# Patient Record
Sex: Male | Born: 1960
Health system: Southern US, Community
[De-identification: ages and names within clinical notes are randomized; demographics above are authoritative.]

## PROBLEM LIST (undated history)

## (undated) DIAGNOSIS — J45909 Unspecified asthma, uncomplicated: Secondary | ICD-10-CM

## (undated) DIAGNOSIS — G4730 Sleep apnea, unspecified: Secondary | ICD-10-CM

## (undated) DIAGNOSIS — K219 Gastro-esophageal reflux disease without esophagitis: Secondary | ICD-10-CM

## (undated) DIAGNOSIS — C801 Malignant (primary) neoplasm, unspecified: Secondary | ICD-10-CM

## (undated) DIAGNOSIS — I10 Essential (primary) hypertension: Secondary | ICD-10-CM

## (undated) DIAGNOSIS — I499 Cardiac arrhythmia, unspecified: Secondary | ICD-10-CM

## (undated) HISTORY — DX: Cardiac arrhythmia, unspecified: I49.9

## (undated) HISTORY — DX: Essential (primary) hypertension: I10

## (undated) HISTORY — DX: Malignant (primary) neoplasm, unspecified: C80.1

## (undated) HISTORY — PX: OTHER SURGICAL HISTORY: SHX169

## (undated) HISTORY — DX: Unspecified asthma, uncomplicated: J45.909

## (undated) HISTORY — DX: Gastro-esophageal reflux disease without esophagitis: K21.9

---

## 2011-09-20 DIAGNOSIS — G4733 Obstructive sleep apnea (adult) (pediatric): Secondary | ICD-10-CM | POA: Insufficient documentation

## 2011-09-20 DIAGNOSIS — J45909 Unspecified asthma, uncomplicated: Secondary | ICD-10-CM | POA: Insufficient documentation

## 2012-06-02 DIAGNOSIS — Z8546 Personal history of malignant neoplasm of prostate: Secondary | ICD-10-CM | POA: Insufficient documentation

## 2018-01-22 DIAGNOSIS — Z0000 Encounter for general adult medical examination without abnormal findings: Secondary | ICD-10-CM | POA: Diagnosis not present

## 2018-01-22 DIAGNOSIS — I10 Essential (primary) hypertension: Secondary | ICD-10-CM | POA: Diagnosis not present

## 2018-06-01 DIAGNOSIS — Z23 Encounter for immunization: Secondary | ICD-10-CM | POA: Diagnosis not present

## 2018-10-21 ENCOUNTER — Ambulatory Visit: Payer: Self-pay | Admitting: Family Medicine

## 2018-11-04 ENCOUNTER — Ambulatory Visit (INDEPENDENT_AMBULATORY_CARE_PROVIDER_SITE_OTHER): Payer: BLUE CROSS/BLUE SHIELD | Admitting: Family Medicine

## 2018-11-04 ENCOUNTER — Other Ambulatory Visit: Payer: Self-pay

## 2018-11-04 ENCOUNTER — Encounter: Payer: Self-pay | Admitting: Family Medicine

## 2018-11-04 VITALS — BP 130/96 | HR 69 | Temp 98.0°F | Ht 72.0 in | Wt 202.2 lb

## 2018-11-04 DIAGNOSIS — Z0000 Encounter for general adult medical examination without abnormal findings: Secondary | ICD-10-CM | POA: Diagnosis not present

## 2018-11-04 DIAGNOSIS — Z8546 Personal history of malignant neoplasm of prostate: Secondary | ICD-10-CM | POA: Diagnosis not present

## 2018-11-04 DIAGNOSIS — I10 Essential (primary) hypertension: Secondary | ICD-10-CM | POA: Insufficient documentation

## 2018-11-04 DIAGNOSIS — E785 Hyperlipidemia, unspecified: Secondary | ICD-10-CM | POA: Diagnosis not present

## 2018-11-04 LAB — COMPREHENSIVE METABOLIC PANEL
ALT: 22 U/L (ref 0–53)
AST: 16 U/L (ref 0–37)
Albumin: 4.7 g/dL (ref 3.5–5.2)
Alkaline Phosphatase: 75 U/L (ref 39–117)
BUN: 16 mg/dL (ref 6–23)
CO2: 33 mEq/L — ABNORMAL HIGH (ref 19–32)
Calcium: 10 mg/dL (ref 8.4–10.5)
Chloride: 98 mEq/L (ref 96–112)
Creatinine, Ser: 0.88 mg/dL (ref 0.40–1.50)
GFR: 88.84 mL/min (ref >60.00)
Glucose, Bld: 95 mg/dL (ref 70–99)
Potassium: 3.8 mEq/L (ref 3.5–5.1)
Sodium: 137 mEq/L (ref 135–145)
Total Bilirubin: 0.9 mg/dL (ref 0.2–1.2)
Total Protein: 7.5 g/dL (ref 6.0–8.3)

## 2018-11-04 LAB — POCT URINALYSIS DIPSTICK
Bilirubin, UA: NEGATIVE
Blood, UA: NEGATIVE
Glucose, UA: NEGATIVE
Ketones, UA: NEGATIVE
LEUKOCYTES UA: NEGATIVE
Nitrite, UA: NEGATIVE
Protein, UA: NEGATIVE
Spec Grav, UA: >=1.03 — AB (ref 1.010–1.025)
Urobilinogen, UA: 0.2 E.U./dL
pH, UA: 5 (ref 5.0–8.0)

## 2018-11-04 LAB — LIPID PANEL
Cholesterol: 179 mg/dL (ref 0–200)
HDL: 59.7 mg/dL (ref >39.00)
LDL Cholesterol: 97 mg/dL (ref 0–99)
NonHDL: 118.84
Total CHOL/HDL Ratio: 3
Triglycerides: 110 mg/dL (ref 0.0–149.0)
VLDL: 22 mg/dL (ref 0.0–40.0)

## 2018-11-04 LAB — CBC
HCT: 47.1 % (ref 39.0–52.0)
Hemoglobin: 16.1 g/dL (ref 13.0–17.0)
MCHC: 34.1 g/dL (ref 30.0–36.0)
MCV: 91.2 fl (ref 78.0–100.0)
Platelets: 223 10*3/uL (ref 150.0–400.0)
RBC: 5.17 Mil/uL (ref 4.22–5.81)
RDW: 13.6 % (ref 11.5–15.5)
WBC: 8.8 10*3/uL (ref 4.0–10.5)

## 2018-11-04 LAB — PSA: PSA: 0.32 ng/mL (ref 0.10–4.00)

## 2018-11-04 LAB — TSH: TSH: 3.34 u[IU]/mL (ref 0.35–4.50)

## 2018-11-04 MED ORDER — OMEPRAZOLE 40 MG PO CPDR: 40.0000 mg | DELAYED_RELEASE_CAPSULE | Freq: Every day | ORAL | 3 refills | Status: DC

## 2018-11-04 MED ORDER — HYDROCHLOROTHIAZIDE 25 MG PO TABS: 25.0000 mg | ORAL_TABLET | Freq: Every day | ORAL | 3 refills | Status: DC

## 2018-11-04 MED ORDER — AZELASTINE HCL 0.1 % NA SOLN: 2.0000 | Freq: Two times a day (BID) | NASAL | 6 refills | Status: DC

## 2018-11-04 MED ORDER — CARVEDILOL 12.5 MG PO TABS: 12.5000 mg | ORAL_TABLET | Freq: Two times a day (BID) | ORAL | 3 refills | Status: DC

## 2018-11-04 MED ORDER — LOSARTAN POTASSIUM 100 MG PO TABS: 100.0000 mg | ORAL_TABLET | Freq: Every day | ORAL | 3 refills | Status: DC

## 2018-11-04 NOTE — Assessment & Plan Note (Signed)
-  Well adult Orders Placed This Encounter  Procedures  . Comp Met (CMET)  . CBC  . Lipid Profile  . TSH  . PSA  . POCT Urinalysis Dipstick   Immunizations:  Up to date Screenings: up to date Anticipatory guidance/risk factor reduction:  Per AVS

## 2018-11-04 NOTE — Progress Notes (Signed)
Clarence Perez - 58 y.o. male MRN [id number]  Date of birth: 1961-06-19  Subjective Chief Complaint  Patient presents with  . Establish Care    CPE-- Fasting last meal 630 am/ last colonoscopy 2017    HPI Clarence Perez is a 58 y.o. male with history of HTN, prostate cancer s/p cryoablation, GERD, mild HLD, asthma/seasonal allergies here today for initial visit and annual exam.  He reports he is doing well.  He is up to date on colon cancer screening with last test completed in 2017.  He is due for repeat in 2022.  He has no concerns today.  He remains active and follows a fairly healthy diet.  He does have history of OSA as well but has been unable to tolerate CPAP.    Review of Systems  Constitutional: Negative for chills, fever, malaise/fatigue and weight loss.  HENT: Negative for congestion, ear pain and sore throat.   Eyes: Negative for blurred vision, double vision and pain.  Respiratory: Negative for cough and shortness of breath.   Cardiovascular: Negative for chest pain and palpitations.  Gastrointestinal: Negative for abdominal pain, blood in stool, constipation, heartburn and nausea.  Genitourinary: Negative for dysuria and urgency.  Musculoskeletal: Negative for joint pain and myalgias.  Neurological: Negative for dizziness and headaches.  Endo/Heme/Allergies: Does not bruise/bleed easily.  Psychiatric/Behavioral: Negative for depression. The patient is not nervous/anxious and does not have insomnia.     Allergies  Allergen Reactions  . Ace Inhibitors Cough    Past Medical History:  Diagnosis Date  . Asthma   . Cancer [facility])    prostate   . GERD (gastroesophageal reflux disease)   . Hypertension     Past Surgical History:  Procedure Laterality Date  . prostate cryoblation      Social History   Socioeconomic History  . Marital status: Married    Spouse name: Not on file  . Number of children: Not on file  . Years of education: Not on file  . Highest  education level: Not on file  Occupational History  . Not on file  Social Needs  . Financial resource strain: Not on file  . Food insecurity:    Worry: Not on file    Inability: Not on file  . Transportation needs:    Medical: Not on file    Non-medical: Not on file  Tobacco Use  . Smoking status: Never Smoker  . Smokeless tobacco: Never Used  Substance and Sexual Activity  . Alcohol use: Yes    Comment: socially  . Drug use: Never  . Sexual activity: Not on file  Lifestyle  . Physical activity:    Days per week: Not on file    Minutes per session: Not on file  . Stress: Not on file  Relationships  . Social connections:    Talks on phone: Not on file    Gets together: Not on file    Attends religious service: Not on file    Active member of club or organization: Not on file    Attends meetings of clubs or organizations: Not on file    Relationship status: Not on file  Other Topics Concern  . Not on file  Social History Narrative  . Not on file    Family History  Problem Relation Age of Onset  . Cancer Mother        colon  . Cancer Father        prostate and kidney  Health Maintenance  Topic Date Due  . Hepatitis C Screening  10/15/1959  . HIV Screening  07/05/1975  . COLONOSCOPY  07/04/2010  . TETANUS/TDAP  07/21/2019  . INFLUENZA VACCINE  Completed    ----------------------------------------------------------------------------------------------------------------------------------------------------------------------------------------------------------------- Physical Exam BP (!) 130/96   Pulse 69   Temp 98 F (36.7 C) (Oral)   Ht 6' (1.829 m)   Wt 202 lb 3.2 oz (91.7 kg)   SpO2 96%   BMI 27.42 kg/m   Physical Exam Constitutional:      General: He is not in acute distress. HENT:     Head: Normocephalic and atraumatic.     Right Ear: External ear normal.     Left Ear: External ear normal.     Mouth/Throat:     Mouth: Mucous membranes are  moist.  Eyes:     General: No scleral icterus. Neck:     Musculoskeletal: Normal range of motion.     Thyroid: No thyromegaly.  Cardiovascular:     Rate and Rhythm: Normal rate and regular rhythm.     Heart sounds: Normal heart sounds.  Pulmonary:     Effort: Pulmonary effort is normal.     Breath sounds: Normal breath sounds.  Abdominal:     General: Bowel sounds are normal. There is no distension.     Palpations: Abdomen is soft.     Tenderness: There is no abdominal tenderness. There is no guarding.  Lymphadenopathy:     Cervical: No cervical adenopathy.  Skin:    General: Skin is warm and dry.     Findings: No rash.  Neurological:     General: No focal deficit present.     Mental Status: He is alert and oriented to person, place, and time.     Cranial Nerves: No cranial nerve deficit.     Motor: No abnormal muscle tone.  Psychiatric:        Mood and Affect: Mood normal.        Behavior: Behavior normal.     ------------------------------------------------------------------------------------------------------------------------------------------------------------------------------------------------------------------- Assessment and Plan  Well adult exam -Well adult Orders Placed This Encounter  Procedures  . Comp Met (CMET)  . CBC  . Lipid Profile  . TSH  . PSA  . POCT Urinalysis Dipstick   Immunizations:  Up to date Screenings: up to date Anticipatory guidance/risk factor reduction:  Per AVS  Hyperlipidemia -Update lipid panel today.   History of prostate cancer -due for annual PSA, ordered.   Essential hypertension -Fairly well controlled, recommend continuation of current medications.

## 2018-11-04 NOTE — Patient Instructions (Signed)
Preventive Care 40-64 Years, Male Preventive care refers to lifestyle choices and visits with your health care provider that can promote health and wellness. What does preventive care include?   A yearly physical exam. This is also called an annual well check.  Dental exams once or twice a year.  Routine eye exams. Ask your health care provider how often you should have your eyes checked.  Personal lifestyle choices, including: ? Daily care of your teeth and gums. ? Regular physical activity. ? Eating a healthy diet. ? Avoiding tobacco and drug use. ? Limiting alcohol use. ? Practicing safe sex. ? Taking low-dose aspirin every day starting at age 50. What happens during an annual well check? The services and screenings done by your health care provider during your annual well check will depend on your age, overall health, lifestyle risk factors, and family history of disease. Counseling Your health care provider may ask you questions about your:  Alcohol use.  Tobacco use.  Drug use.  Emotional well-being.  Home and relationship well-being.  Sexual activity.  Eating habits.  Work and work environment. Screening You may have the following tests or measurements:  Height, weight, and BMI.  Blood pressure.  Lipid and cholesterol levels. These may be checked every 5 years, or more frequently if you are over 50 years old.  Skin check.  Lung cancer screening. You may have this screening every year starting at age 55 if you have a 30-pack-year history of smoking and currently smoke or have quit within the past 15 years.  Colorectal cancer screening. All adults should have this screening starting at age 50 and continuing until age 75. Your health care provider may recommend screening at age 45. You will have tests every 1-10 years, depending on your results and the type of screening test. People at increased risk should start screening at an earlier age. Screening tests may  include: ? Guaiac-based fecal occult blood testing. ? Fecal immunochemical test (FIT). ? Stool DNA test. ? Virtual colonoscopy. ? Sigmoidoscopy. During this test, a flexible tube with a tiny camera (sigmoidoscope) is used to examine your rectum and lower colon. The sigmoidoscope is inserted through your anus into your rectum and lower colon. ? Colonoscopy. During this test, a long, thin, flexible tube with a tiny camera (colonoscope) is used to examine your entire colon and rectum.  Prostate cancer screening. Recommendations will vary depending on your family history and other risks.  Hepatitis C blood test.  Hepatitis B blood test.  Sexually transmitted disease (STD) testing.  Diabetes screening. This is done by checking your blood sugar (glucose) after you have not eaten for a while (fasting). You may have this done every 1-3 years. Discuss your test results, treatment options, and if necessary, the need for more tests with your health care provider. Vaccines Your health care provider may recommend certain vaccines, such as:  Influenza vaccine. This is recommended every year.  Tetanus, diphtheria, and acellular pertussis (Tdap, Td) vaccine. You may need a Td booster every 10 years.  Varicella vaccine. You may need this if you have not been vaccinated.  Zoster vaccine. You may need this after age 60.  Measles, mumps, and rubella (MMR) vaccine. You may need at least one dose of MMR if you were born in 1957 or later. You may also need a second dose.  Pneumococcal 13-valent conjugate (PCV13) vaccine. You may need this if you have certain conditions and have not been vaccinated.  Pneumococcal polysaccharide (PPSV23) vaccine.   You may need one or two doses if you smoke cigarettes or if you have certain conditions.  Meningococcal vaccine. You may need this if you have certain conditions.  Hepatitis A vaccine. You may need this if you have certain conditions or if you travel or work in  places where you may be exposed to hepatitis A.  Hepatitis B vaccine. You may need this if you have certain conditions or if you travel or work in places where you may be exposed to hepatitis B.  Haemophilus influenzae type b (Hib) vaccine. You may need this if you have certain risk factors. Talk to your health care provider about which screenings and vaccines you need and how often you need them. This information is not intended to replace advice given to you by your health care provider. Make sure you discuss any questions you have with your health care provider. Document Released: 09/01/2015 Document Revised: 09/25/2017 Document Reviewed: 06/06/2015 Elsevier Interactive Patient Education  2019 Elsevier Inc.  

## 2018-11-04 NOTE — Assessment & Plan Note (Signed)
-  Fairly well controlled, recommend continuation of current medications.

## 2018-11-04 NOTE — Assessment & Plan Note (Signed)
-  due for annual PSA, ordered.

## 2018-11-04 NOTE — Assessment & Plan Note (Signed)
Update lipid panel today 

## 2019-06-11 DIAGNOSIS — Z7189 Other specified counseling: Secondary | ICD-10-CM | POA: Diagnosis not present

## 2019-10-25 ENCOUNTER — Other Ambulatory Visit: Payer: Self-pay | Admitting: Family Medicine

## 2019-10-25 NOTE — Telephone Encounter (Signed)
CM-Plz see refill req/thx dmf 

## 2020-01-29 ENCOUNTER — Other Ambulatory Visit: Payer: Self-pay | Admitting: Family Medicine

## 2020-02-01 NOTE — Telephone Encounter (Signed)
CM-Plz see refill req/thx dmf 

## 2020-02-09 ENCOUNTER — Ambulatory Visit (INDEPENDENT_AMBULATORY_CARE_PROVIDER_SITE_OTHER): Payer: BC Managed Care – PPO | Admitting: Family Medicine

## 2020-02-09 ENCOUNTER — Other Ambulatory Visit: Payer: Self-pay

## 2020-02-09 ENCOUNTER — Encounter: Payer: Self-pay | Admitting: Family Medicine

## 2020-02-09 VITALS — BP 146/90 | HR 62 | Temp 97.3°F | Ht 72.0 in | Wt 204.2 lb

## 2020-02-09 DIAGNOSIS — I10 Essential (primary) hypertension: Secondary | ICD-10-CM

## 2020-02-09 DIAGNOSIS — Z0000 Encounter for general adult medical examination without abnormal findings: Secondary | ICD-10-CM

## 2020-02-09 DIAGNOSIS — Z8546 Personal history of malignant neoplasm of prostate: Secondary | ICD-10-CM

## 2020-02-09 DIAGNOSIS — E785 Hyperlipidemia, unspecified: Secondary | ICD-10-CM

## 2020-02-09 DIAGNOSIS — K2100 Gastro-esophageal reflux disease with esophagitis, without bleeding: Secondary | ICD-10-CM

## 2020-02-09 DIAGNOSIS — J4520 Mild intermittent asthma, uncomplicated: Secondary | ICD-10-CM

## 2020-02-09 LAB — BASIC METABOLIC PANEL
BUN: 12 mg/dL (ref 6–23)
CO2: 34 mEq/L — ABNORMAL HIGH (ref 19–32)
Calcium: 10.2 mg/dL (ref 8.4–10.5)
Chloride: 98 mEq/L (ref 96–112)
Creatinine, Ser: 0.93 mg/dL (ref 0.40–1.50)
GFR: 82.99 mL/min (ref >60.00)
Glucose, Bld: 102 mg/dL — ABNORMAL HIGH (ref 70–99)
Potassium: 3.9 mEq/L (ref 3.5–5.1)
Sodium: 137 mEq/L (ref 135–145)

## 2020-02-09 LAB — LIPID PANEL
Cholesterol: 184 mg/dL (ref 0–200)
HDL: 46.8 mg/dL (ref >39.00)
LDL Cholesterol: 103 mg/dL — ABNORMAL HIGH (ref 0–99)
NonHDL: 136.99
Total CHOL/HDL Ratio: 4
Triglycerides: 171 mg/dL — ABNORMAL HIGH (ref 0.0–149.0)
VLDL: 34.2 mg/dL (ref 0.0–40.0)

## 2020-02-09 LAB — ALT: ALT: 26 U/L (ref 0–53)

## 2020-02-09 LAB — AST: AST: 19 U/L (ref 0–37)

## 2020-02-09 LAB — PSA: PSA: 0.29 ng/mL (ref 0.10–4.00)

## 2020-02-09 MED ORDER — HYDROCHLOROTHIAZIDE 25 MG PO TABS: 25.0000 mg | ORAL_TABLET | Freq: Every day | ORAL | 3 refills | Status: DC

## 2020-02-09 MED ORDER — LOSARTAN POTASSIUM 100 MG PO TABS: 100.0000 mg | ORAL_TABLET | Freq: Every day | ORAL | 3 refills | Status: DC

## 2020-02-09 MED ORDER — AZELASTINE HCL 0.1 % NA SOLN: 2.0000 | Freq: Two times a day (BID) | NASAL | 6 refills | Status: DC

## 2020-02-09 MED ORDER — CARVEDILOL 12.5 MG PO TABS: 12.5000 mg | ORAL_TABLET | Freq: Two times a day (BID) | ORAL | 3 refills | Status: DC

## 2020-02-09 NOTE — Progress Notes (Signed)
Clarence Perez is a 59 y.o. male  Chief Complaint  Patient presents with  . Establish Care    Pt here for a physical.  Pt is fasting for lab, he had breakfast around 6:30am.    HPI: Clarence Perez is a 59 y.o. male here for [facility] appt, previous PCP Dr. Zigmund Daniel, and annual CPE. He ate breakfast around 6:30am this AM. Pt is pain management physician.   Last Colonoscopy: due in 2022 - previously done thru [facility]  Diet/Exercise: average diet, working to lose weight and has lost 5lbs recently  Dental: UTD Eye: pt wears glasses, UTD on exam  Med refills needed today: yes - see orders  Past Medical History:  Diagnosis Date  . Asthma   . Cancer [facility])    prostate   . GERD (gastroesophageal reflux disease)   . Hypertension     Past Surgical History:  Procedure Laterality Date  . prostate cryoblation      Social History   Socioeconomic History  . Marital status: Married    Spouse name: Not on file  . Number of children: Not on file  . Years of education: Not on file  . Highest education level: Not on file  Occupational History  . Not on file  Tobacco Use  . Smoking status: Never Smoker  . Smokeless tobacco: Never Used  Vaping Use  . Vaping Use: Never used  Substance and Sexual Activity  . Alcohol use: Yes    Comment: socially  . Drug use: Never  . Sexual activity: Not on file  Other Topics Concern  . Not on file  Social History Narrative  . Not on file   Social Determinants of Health   Financial Resource Strain:   . Difficulty of Paying Living Expenses:   Food Insecurity:   . Worried About Charity fundraiser in the Last Year:   . Arboriculturist in the Last Year:   Transportation Needs:   . Film/video editor (Medical):   Marland Kitchen Lack of Transportation (Non-Medical):   Physical Activity:   . Days of Exercise per Week:   . Minutes of Exercise per Session:   Stress:   . Feeling of Stress :   Social Connections:   . Frequency of Communication with Friends  and Family:   . Frequency of Social Gatherings with Friends and Family:   . Attends Religious Services:   . Active Member of Clubs or Organizations:   . Attends Archivist Meetings:   Marland Kitchen Marital Status:   Intimate Partner Violence:   . Fear of Current or Ex-Partner:   . Emotionally Abused:   Marland Kitchen Physically Abused:   . Sexually Abused:     Family History  Problem Relation Age of Onset  . Cancer Mother        colon  . Cancer Father        prostate and kidney     Immunization History  Administered Date(s) Administered  . Influenza,inj,Quad PF,6+ Mos 06/01/2018  . Influenza-Unspecified 08/19/2008, 05/29/2012  . Tdap 07/20/2009    Outpatient Encounter Medications as of 02/09/2020  Medication Sig  . albuterol (PROVENTIL HFA;VENTOLIN HFA) 108 (90 Base) MCG/ACT inhaler Inhale into the lungs.  Marland Kitchen azelastine (ASTELIN) 0.1 % nasal spray Place 2 sprays into both nostrils 2 (two) times daily. Use in each nostril as directed  . cetirizine (ZYRTEC) 10 MG tablet Take by mouth.  . diclofenac Sodium (VOLTAREN) 1 % GEL 2 gm applied to each  shoulder 1-2 times daily  . fluticasone (FLONASE) 50 MCG/ACT nasal spray USE ONE SPRAY IN EACH NOSTRIL TWO TIMES A DAY  . hydrochlorothiazide (HYDRODIURIL) 25 MG tablet Take 1 tablet (25 mg total) by mouth daily.  Marland Kitchen losartan (COZAAR) 100 MG tablet Take 1 tablet (100 mg total) by mouth daily.  Marland Kitchen omeprazole (PRILOSEC) 40 MG capsule Take 1 capsule (40 mg total) by mouth daily.  . [DISCONTINUED] azelastine (ASTELIN) 0.1 % nasal spray USE 2 SPRAYS IN EACH NOSTRIL TWICE DAILY  . [DISCONTINUED] hydrochlorothiazide (HYDRODIURIL) 25 MG tablet Take 1 tablet (25 mg total) by mouth daily.  . [DISCONTINUED] losartan (COZAAR) 100 MG tablet Take 1 tablet (100 mg total) by mouth daily.  . carvedilol (COREG) 12.5 MG tablet Take 1 tablet (12.5 mg total) by mouth 2 (two) times daily with a meal.  . Magnesium Citrate 125 MG CAPS Take by mouth. (Patient not taking: Reported  on 02/09/2020)  . [DISCONTINUED] carvedilol (COREG) 12.5 MG tablet Take 1 tablet (12.5 mg total) by mouth 2 (two) times daily with a meal.   No facility-administered encounter medications on file as of 02/09/2020.     ROS: Gen: no fever, chills, fatigue Skin: no rash, itching ENT: no ear pain, ear drainage, nasal congestion, rhinorrhea, sinus pressure, sore throat Resp: pt has h/o asthma and at times experiences chest tightness and cough, relieved with albuterol PRN, uses 1x/wk, sometimes less CV: no CP, palpitations, LE edema GI: no heartburn, n/v/d/c, abd pain GU: no dysuria, urgency, frequency, hematuria; h/o prostate cancer MSK: no joint pain, myalgias, back pain Neuro: no dizziness, headache, weakness; pt with generalized fasciculations x 2 years since viral URI  Psych: no depression, anxiety, insomnia   Allergies  Allergen Reactions  . Ace Inhibitors Cough    BP (!) 146/90 (BP Location: Left Arm, Patient Position: Sitting, Cuff Size: Normal)   Pulse 62   Temp (!) 97.3 F (36.3 C) (Temporal)   Ht 6' (1.829 m)   Wt 204 lb 3.2 oz (92.6 kg)   SpO2 96%   BMI 27.69 kg/m    BP Readings from Last 3 Encounters:  02/09/20 (!) 146/90  11/04/18 (!) 130/96   Wt Readings from Last 3 Encounters:  02/09/20 204 lb 3.2 oz (92.6 kg)  11/04/18 202 lb 3.2 oz (91.7 kg)    Physical Exam Constitutional:      General: He is not in acute distress.    Appearance: He is well-developed.  HENT:     Head: Normocephalic and atraumatic.     Right Ear: Tympanic membrane and ear canal normal.     Left Ear: Tympanic membrane and ear canal normal.     Nose: Nose normal.  Neck:     Thyroid: No thyromegaly.  Cardiovascular:     Rate and Rhythm: Normal rate and regular rhythm.     Heart sounds: Normal heart sounds.  Pulmonary:     Effort: Pulmonary effort is normal.     Breath sounds: Normal breath sounds. No wheezing or rhonchi.  Abdominal:     General: Bowel sounds are normal. There is no  distension.     Palpations: Abdomen is soft. There is no mass.     Tenderness: There is no abdominal tenderness. There is no guarding or rebound.  Musculoskeletal:        General: Normal range of motion.     Cervical back: Neck supple.  Lymphadenopathy:     Cervical: No cervical adenopathy.  Skin:    General:  Skin is warm and dry.  Neurological:     Mental Status: He is alert and oriented to person, place, and time.     Motor: No abnormal muscle tone.     Coordination: Coordination normal.      A/P:  1. Annual physical exam - UTD on colonoscopy - due in 2022 - UTD on immunizations - UTD on dental and vision - discussed importance of regular CV exercise, healthy diet, adequate sleep - pt has lost 5lbs recently! - Lipid panel - Basic metabolic panel - AST - ALT - next CPE in 1 year  2. Essential hypertension - not at goal - recommended DASH diet, regular CV exercise, work to achieve and maintain goal weight - pt to check BP at home ~ 3x/wk x 2 wks then send or call with BP readings. If above goal, consider increase in coreg from 12.5mg  BID. With asthma hx, could consider 12.5mg  qAM and 12.5mg  qPM or even 12.5mg  TID dosing Refill: - carvedilol (COREG) 12.5 MG tablet; Take 1 tablet (12.5 mg total) by mouth 2 (two) times daily with a meal.  Dispense: 180 tablet; Refill: 3 - hydrochlorothiazide (HYDRODIURIL) 25 MG tablet; Take 1 tablet (25 mg total) by mouth daily.  Dispense: 90 tablet; Refill: 3 - losartan (COZAAR) 100 MG tablet; Take 1 tablet (100 mg total) by mouth daily.  Dispense: 90 tablet; Refill: 3 - Basic metabolic panel  3. Hyperlipidemia, unspecified hyperlipidemia type - not on med - Lipid panel  4. History of prostate cancer - not longer follows with urology, just annual PSA - PSA  5. Gastroesophageal reflux disease with esophagitis without hemorrhage - stable, controlled with diet/lifestyle modification, PPI, and PRN pepcid complete  6. Mild intermittent  asthma without complication - overall well controlled, stable - uses PRN albuterol 1x/wk or every few weeks, notes using it a bit more frequently in recent weeks - no recent exacerbations   This visit occurred during the SARS-CoV-2 public health emergency.  Safety protocols were in place, including screening questions prior to the visit, additional usage of staff PPE, and extensive cleaning of exam room while observing appropriate contact time as indicated for disinfecting solutions.

## 2020-02-09 NOTE — Patient Instructions (Signed)
Health Maintenance Due  Topic Date Due  . Hepatitis C Screening  Never done  . COVID-19 Vaccine (1) Never done  . HIV Screening  Never done  . TETANUS/TDAP  07/21/2019    No flowsheet data found.

## 2020-02-10 ENCOUNTER — Encounter: Payer: Self-pay | Admitting: Family Medicine

## 2020-03-25 ENCOUNTER — Other Ambulatory Visit: Payer: Self-pay | Admitting: Family Medicine

## 2020-12-09 ENCOUNTER — Other Ambulatory Visit: Payer: Self-pay | Admitting: Family Medicine

## 2021-02-09 ENCOUNTER — Other Ambulatory Visit: Payer: Self-pay | Admitting: Family Medicine

## 2021-02-09 DIAGNOSIS — I10 Essential (primary) hypertension: Secondary | ICD-10-CM

## 2021-02-14 ENCOUNTER — Encounter: Payer: Self-pay | Admitting: Family Medicine

## 2021-02-14 ENCOUNTER — Ambulatory Visit (INDEPENDENT_AMBULATORY_CARE_PROVIDER_SITE_OTHER): Payer: 59 | Admitting: Family Medicine

## 2021-02-14 ENCOUNTER — Other Ambulatory Visit: Payer: Self-pay

## 2021-02-14 VITALS — BP 136/86 | HR 86 | Temp 98.2°F | Ht 71.5 in | Wt 202.4 lb

## 2021-02-14 DIAGNOSIS — R002 Palpitations: Secondary | ICD-10-CM

## 2021-02-14 DIAGNOSIS — I10 Essential (primary) hypertension: Secondary | ICD-10-CM | POA: Diagnosis not present

## 2021-02-14 DIAGNOSIS — Z8546 Personal history of malignant neoplasm of prostate: Secondary | ICD-10-CM | POA: Diagnosis not present

## 2021-02-14 DIAGNOSIS — J4520 Mild intermittent asthma, uncomplicated: Secondary | ICD-10-CM

## 2021-02-14 DIAGNOSIS — Z0000 Encounter for general adult medical examination without abnormal findings: Secondary | ICD-10-CM | POA: Diagnosis not present

## 2021-02-14 DIAGNOSIS — Z1211 Encounter for screening for malignant neoplasm of colon: Secondary | ICD-10-CM

## 2021-02-14 DIAGNOSIS — Z23 Encounter for immunization: Secondary | ICD-10-CM

## 2021-02-14 DIAGNOSIS — E785 Hyperlipidemia, unspecified: Secondary | ICD-10-CM | POA: Diagnosis not present

## 2021-02-14 DIAGNOSIS — Z1212 Encounter for screening for malignant neoplasm of rectum: Secondary | ICD-10-CM

## 2021-02-14 NOTE — Progress Notes (Signed)
Clarence Perez is a 60 y.o. male  Chief Complaint  Patient presents with   Annual Exam    HPI: Clarence Perez is a 60 y.o. male patient seen today for annual CPE, fasting labs.   Last Colonoscopy: with [facility], due in 2022.  H/o prostate cancer. No longer follows with urology, just annual PSA.   Dental: UTD Eye: pt wears glasses, UTD on exam  Shingles, Tdap vaccines due.  Palpitations - 5-6 years ago; "marked sinus arrhythmia" on EKG at that time.  Occasional palpitations, mild nausea, mild orthostasis - symptoms last hours (1/2 day or so). No CP, SOB. Feels like he needs to take a deep breath. Happens every couple of months. Last episode yesterday AM.  Happens more frequently when skiing in [place] and staying at higher elevation.  Seems to happen more when he doesn't sleep well.  Devotes 8 hours per night and thinks he gets 6hrs per night. Wakes 2x/night to void.  No AM headaches, daytime fatigue.  Sleep study at [facility] 2010-2011. "Severe OSA", tried CPAP but did not tolerate.  Past Medical History:  Diagnosis Date   Asthma    Cancer ([place])    prostate    GERD (gastroesophageal reflux disease)    Hypertension     Past Surgical History:  Procedure Laterality Date   prostate cryoblation      Social History   Socioeconomic History   Marital status: Married    Spouse name: Not on file   Number of children: Not on file   Years of education: Not on file   Highest education level: Not on file  Occupational History   Not on file  Tobacco Use   Smoking status: Never   Smokeless tobacco: Never  Vaping Use   Vaping Use: Never used  Substance and Sexual Activity   Alcohol use: Yes    Comment: socially   Drug use: Never   Sexual activity: Not on file  Other Topics Concern   Not on file  Social History Narrative   Not on file   Social Determinants of Health   Financial Resource Strain: Not on file  Food Insecurity: Not on file  Transportation Needs: Not on file   Physical Activity: Not on file  Stress: Not on file  Social Connections: Not on file  Intimate Partner Violence: Not on file    Family History  Problem Relation Age of Onset   Cancer Mother        colon   Cancer Father        prostate and kidney     Immunization History  Administered Date(s) Administered   Influenza,inj,Quad PF,6+ Mos 06/01/2018   Influenza-Unspecified 08/19/2008, 05/29/2012   Tdap 07/20/2009    Outpatient Encounter Medications as of 02/14/2021  Medication Sig   albuterol (PROVENTIL HFA;VENTOLIN HFA) 108 (90 Base) MCG/ACT inhaler Inhale into the lungs.   azelastine (ASTELIN) 0.1 % nasal spray USE 2 SPRAYS IN EACH NOSTRIL TWICE DAILY AS DIRECTED   carvedilol (COREG) 12.5 MG tablet TAKE 1 TABLET(12.5 MG) BY MOUTH TWICE DAILY WITH A MEAL   cetirizine (ZYRTEC) 10 MG tablet Take by mouth.   diclofenac Sodium (VOLTAREN) 1 % GEL 2 gm applied to each shoulder 1-2 times daily   fluticasone (FLONASE) 50 MCG/ACT nasal spray USE ONE SPRAY IN EACH NOSTRIL TWO TIMES A DAY   hydrochlorothiazide (HYDRODIURIL) 25 MG tablet Take 1 tablet (25 mg total) by mouth daily.   losartan (COZAAR) 100 MG tablet Take 1 tablet (  100 mg total) by mouth daily.   omeprazole (PRILOSEC) 40 MG capsule TAKE 1 CAPSULE(40 MG) BY MOUTH DAILY   [DISCONTINUED] acetaZOLAMIDE (DIAMOX) 250 MG tablet Take 250 mg by mouth 2 (two) times daily. (Patient not taking: Reported on 02/14/2021)   [DISCONTINUED] carvedilol (COREG) 12.5 MG tablet Take 1 tablet (12.5 mg total) by mouth 2 (two) times daily with a meal.   [DISCONTINUED] Magnesium Citrate 125 MG CAPS Take by mouth. (Patient not taking: No sig reported)   No facility-administered encounter medications on file as of 02/14/2021.     ROS: Gen: no fever, chills  Skin: no rash, itching ENT: no ear pain, ear drainage, nasal congestion, rhinorrhea, sinus pressure, sore throat Eyes: no blurry vision, double vision Resp: no cough, wheeze,SOB CV: as above in  HPI GI: no heartburn, n/v/d/c, abd pain GU: no dysuria, urgency, frequency, hematuria; no testicular swelling or masses MSK: no joint pain, myalgias, back pain Neuro: no dizziness, headache, weakness, vertigo Psych: no depression, anxiety, insomnia   Allergies  Allergen Reactions   Ace Inhibitors Cough    BP 136/86 (BP Location: Left Arm, Patient Position: Sitting, Cuff Size: Large)   Pulse 86   Temp 98.2 F (36.8 C) (Temporal)   Ht 5' 11.5" (1.816 m)   Wt 202 lb 6.4 oz (91.8 kg)   SpO2 98%   BMI 27.84 kg/m  Wt Readings from Last 3 Encounters:  02/14/21 202 lb 6.4 oz (91.8 kg)  02/09/20 204 lb 3.2 oz (92.6 kg)  11/04/18 202 lb 3.2 oz (91.7 kg)   Temp Readings from Last 3 Encounters:  02/14/21 98.2 F (36.8 C) (Temporal)  02/09/20 (!) 97.3 F (36.3 C) (Temporal)  11/04/18 98 F (36.7 C) (Oral)   BP Readings from Last 3 Encounters:  02/14/21 136/86  02/09/20 (!) 146/90  11/04/18 (!) 130/96   Pulse Readings from Last 3 Encounters:  02/14/21 86  02/09/20 62  11/04/18 69     Physical Exam Constitutional:      General: He is not in acute distress.    Appearance: He is well-developed.  HENT:     Head: Normocephalic and atraumatic.     Right Ear: Tympanic membrane and ear canal normal.     Left Ear: Tympanic membrane and ear canal normal.     Nose: Nose normal.     Mouth/Throat:     Mouth: Mucous membranes are moist.     Pharynx: Oropharynx is clear.  Eyes:     Extraocular Movements: Extraocular movements intact.  Neck:     Thyroid: No thyromegaly.  Cardiovascular:     Rate and Rhythm: Normal rate and regular rhythm.     Heart sounds: Normal heart sounds.  Pulmonary:     Effort: Pulmonary effort is normal.     Breath sounds: Normal breath sounds. No wheezing or rhonchi.  Abdominal:     General: Bowel sounds are normal. There is no distension.     Palpations: Abdomen is soft. There is no mass.     Tenderness: There is no abdominal tenderness. There is no  guarding or rebound.  Musculoskeletal:     Cervical back: Neck supple.     Right lower leg: No edema.     Left lower leg: No edema.  Lymphadenopathy:     Cervical: No cervical adenopathy.  Skin:    General: Skin is warm and dry.  Neurological:     Mental Status: He is alert and oriented to person, place, and time.  Motor: No abnormal muscle tone.     Coordination: Coordination normal.  Psychiatric:        Mood and Affect: Mood normal.        Behavior: Behavior normal.     A/P:  1. Annual physical exam - discussed importance of regular CV exercise, healthy diet, adequate sleep - due for CRC screening, referral placed today - dental and vision UTD - Tdap today, pt states he had shingrix vaccine - CBC - Basic metabolic panel - AST - ALT - EKG 12-Lead - next CPE in 1 year  2. Essential hypertension - at goal - cont losartan 100mg  daily, HCTZ 25mg  daily, coreg 12.5mg  BID - CBC - Basic metabolic panel - EKG 21-RZNB  3. Hyperlipidemia, unspecified hyperlipidemia type - not on statin - Lipid panel - AST - ALT  4. Mild intermittent asthma without complication - stable, controlled  5. History of prostate cancer - no longer follows with urology, just annual PSA - PSA  6. Need for Tdap vaccination - Tdap vaccine greater than or equal to 7yo IM  7. Screening for colorectal cancer - previously done at [facility] - due in 2022 - Ambulatory referral to Gastroenterology  8. Intermittent palpitations - occur every few months x 5-6 years - could be d/t anxiety vs untreated OSA vs other - EKG, labs today. If WNL, would recommend echo and repeat sleep study since last done > 10 years ago - TSH - EKG 12-Lead    This visit occurred during the SARS-CoV-2 public health emergency.  Safety protocols were in place, including screening questions prior to the visit, additional usage of staff PPE, and extensive cleaning of exam room while observing appropriate contact time as  indicated for disinfecting solutions.

## 2021-02-15 ENCOUNTER — Encounter: Payer: Self-pay | Admitting: Family Medicine

## 2021-02-15 LAB — LIPID PANEL
Cholesterol: 174 mg/dL (ref 0–200)
HDL: 51.1 mg/dL (ref >39.00)
LDL Cholesterol: 94 mg/dL (ref 0–99)
NonHDL: 122.95
Total CHOL/HDL Ratio: 3
Triglycerides: 143 mg/dL (ref 0.0–149.0)
VLDL: 28.6 mg/dL (ref 0.0–40.0)

## 2021-02-15 LAB — BASIC METABOLIC PANEL
BUN: 18 mg/dL (ref 6–23)
CO2: 32 mEq/L (ref 19–32)
Calcium: 10.1 mg/dL (ref 8.4–10.5)
Chloride: 99 mEq/L (ref 96–112)
Creatinine, Ser: 1.04 mg/dL (ref 0.40–1.50)
GFR: 77.98 mL/min (ref >60.00)
Glucose, Bld: 99 mg/dL (ref 70–99)
Potassium: 3.9 mEq/L (ref 3.5–5.1)
Sodium: 140 mEq/L (ref 135–145)

## 2021-02-15 LAB — CBC
HCT: 45.1 % (ref 39.0–52.0)
Hemoglobin: 15.4 g/dL (ref 13.0–17.0)
MCHC: 34.2 g/dL (ref 30.0–36.0)
MCV: 90.5 fl (ref 78.0–100.0)
Platelets: 259 10*3/uL (ref 150.0–400.0)
RBC: 4.98 Mil/uL (ref 4.22–5.81)
RDW: 13.3 % (ref 11.5–15.5)
WBC: 6.3 10*3/uL (ref 4.0–10.5)

## 2021-02-15 LAB — AST: AST: 20 U/L (ref 0–37)

## 2021-02-15 LAB — ALT: ALT: 25 U/L (ref 0–53)

## 2021-02-15 LAB — TSH: TSH: 2.92 u[IU]/mL (ref 0.35–5.50)

## 2021-02-15 LAB — PSA: PSA: 0.45 ng/mL (ref 0.10–4.00)

## 2021-02-16 ENCOUNTER — Other Ambulatory Visit: Payer: Self-pay

## 2021-02-16 DIAGNOSIS — I10 Essential (primary) hypertension: Secondary | ICD-10-CM

## 2021-02-16 MED ORDER — LOSARTAN POTASSIUM 100 MG PO TABS: 100.0000 mg | ORAL_TABLET | Freq: Every day | ORAL | 3 refills | Status: DC

## 2021-02-16 MED ORDER — OMEPRAZOLE 40 MG PO CPDR: DELAYED_RELEASE_CAPSULE | ORAL | 3 refills | Status: DC

## 2021-02-16 MED ORDER — CARVEDILOL 12.5 MG PO TABS: ORAL_TABLET | ORAL | 3 refills | Status: DC

## 2021-02-16 MED ORDER — HYDROCHLOROTHIAZIDE 25 MG PO TABS: 25.0000 mg | ORAL_TABLET | Freq: Every day | ORAL | 3 refills | Status: DC

## 2021-02-16 MED ORDER — AZELASTINE HCL 0.1 % NA SOLN: NASAL | 3 refills | Status: DC

## 2021-02-16 NOTE — Telephone Encounter (Signed)
RX refills sent to the pharmacy as requested. Dm/cma

## 2021-02-21 ENCOUNTER — Encounter: Payer: BC Managed Care – PPO | Admitting: Family Medicine

## 2021-03-22 ENCOUNTER — Telehealth: Payer: Self-pay | Admitting: Family Medicine

## 2021-03-22 NOTE — Telephone Encounter (Signed)
Pt would like his referral resent to [facility] on [facility] in [place]. They told him they did not have his referral this morning. He is also wanting a paper copy of this referral, so he can have it in hand. Please advise pt at [phone].

## 2021-03-23 NOTE — Telephone Encounter (Signed)
Order printed and given to patients wife. Dm/cma

## 2021-06-13 ENCOUNTER — Encounter: Payer: Self-pay | Admitting: Nurse Practitioner

## 2021-06-13 ENCOUNTER — Other Ambulatory Visit: Payer: Self-pay

## 2021-06-13 ENCOUNTER — Ambulatory Visit: Payer: 59 | Admitting: Nurse Practitioner

## 2021-06-13 VITALS — BP 136/80 | HR 88 | Temp 97.3°F | Ht 71.5 in | Wt 203.6 lb

## 2021-06-13 DIAGNOSIS — R002 Palpitations: Secondary | ICD-10-CM | POA: Diagnosis not present

## 2021-06-13 DIAGNOSIS — I10 Essential (primary) hypertension: Secondary | ICD-10-CM

## 2021-06-13 NOTE — Patient Instructions (Addendum)
You will be contacted to schedule appt for echocardiogram and to provide event monitor  Call office if symptoms worsen Go to ED if you develop chest pain and/or syncopal episode.  Palpitations Palpitations are feelings that your heartbeat is irregular or is faster than normal. It may feel like your heart is fluttering or skipping a beat. Palpitations are usually not a serious problem. They may be caused by many things, including smoking, caffeine, alcohol, stress, and certain medicines or drugs. Most causes of palpitations are not serious. However, some palpitations can be a sign of a serious problem. You may need further tests to rule out serious medical problems. Follow these instructions at home:   Pay attention to any changes in your condition. Take these actions to help manage your symptoms: Eating and drinking Avoid foods and drinks that may cause palpitations. These may include: Caffeinated coffee, tea, soft drinks, diet pills, and energy drinks. Chocolate. Alcohol. Lifestyle Take steps to reduce your stress and anxiety. Things that can help you relax include: Yoga. Mind-body activities, such as deep breathing, meditation, or using words and images to create positive thoughts (guided imagery). Physical activity, such as swimming, jogging, or walking. Tell your health care provider if your palpitations increase with activity. If you have chest pain or shortness of breath with activity, do not continue the activity until you are seen by your health care provider. Biofeedback. This is a method that helps you learn to use your mind to control things in your body, such as your heartbeat. Do not use drugs, including cocaine or ecstasy. Do not use marijuana. Get plenty of rest and sleep. Keep a regular bed time. General instructions Take over-the-counter and prescription medicines only as told by your health care provider. Do not use any products that contain nicotine or tobacco, such as  cigarettes and e-cigarettes. If you need help quitting, ask your health care provider. Keep all follow-up visits as told by your health care provider. This is important. These may include visits for further testing if palpitations do not go away or get worse. Contact a health care provider if you: Continue to have a fast or irregular heartbeat after 24 hours. Notice that your palpitations occur more often. Get help right away if you: Have chest pain or shortness of breath. Have a severe headache. Feel dizzy or you faint. Summary Palpitations are feelings that your heartbeat is irregular or is faster than normal. It may feel like your heart is fluttering or skipping a beat. Palpitations may be caused by many things, including smoking, caffeine, alcohol, stress, certain medicines, and drugs. Although most causes of palpitations are not serious, some causes can be a sign of a serious medical problem. Get help right away if you faint or have chest pain, shortness of breath, a severe headache, or dizziness. This information is not intended to replace advice given to you by your health care provider. Make sure you discuss any questions you have with your health care provider. Document Revised: 09/10/2017 Document Reviewed: 09/17/2017 Elsevier Patient Education  2022 [employer].

## 2021-06-13 NOTE — Progress Notes (Signed)
Subjective:  Patient ID: Clarence Perez, male    DOB: 03/13/1961  Age: 60 y.o. MRN: [id number]  CC: Establish Care (TOC-Dr. Jerilynn Mages Cirigliano/Pt would like to discuss heart palpitations that has been going on for several years. Denies any numbness or tingling in upper extremities or any other symptoms. )  Palpitations  This is a chronic (onset 48yrs ago) problem. The current episode started more than 1 year ago. The problem occurs intermittently (once a month). The problem has been unchanged. Episode Length: several hours. [place] Associated symptoms include chest fullness and an irregular heartbeat. Pertinent negatives include no anxiety, chest pain, coughing, diaphoresis, dizziness, fever, malaise/fatigue, nausea, near-syncope, numbness, shortness of breath, syncope, vomiting or weakness. He has tried nothing (current use of coreg for HTN) for the symptoms. There are no known risk factors. There is no history of anemia, anxiety, drug use, heart disease, hyperthyroidism or a valve disorder.  Caffeine intake: 1cup in AM ETOH: 1drink about 4x/week 1lead ECG recorded with home monitor: possible sinus tachycardia vs A-fib.  Reviewed past Medical, Social and Family history today.  Outpatient Medications Prior to Visit  Medication Sig Dispense Refill   albuterol (PROVENTIL HFA;VENTOLIN HFA) 108 (90 Base) MCG/ACT inhaler Inhale into the lungs.     azelastine (ASTELIN) 0.1 % nasal spray USE 2 SPRAYS IN EACH NOSTRIL TWICE DAILY AS DIRECTED 90 mL 3   carvedilol (COREG) 12.5 MG tablet TAKE 1 TABLET(12.5 MG) BY MOUTH TWICE DAILY WITH A MEAL 180 tablet 3   cetirizine (ZYRTEC) 10 MG tablet Take by mouth.     diclofenac Sodium (VOLTAREN) 1 % GEL 2 gm applied to each shoulder 1-2 times daily     fluticasone (FLONASE) 50 MCG/ACT nasal spray USE ONE SPRAY IN EACH NOSTRIL TWO TIMES A DAY     hydrochlorothiazide (HYDRODIURIL) 25 MG tablet Take 1 tablet (25 mg total) by mouth daily. 90  tablet 3   losartan (COZAAR) 100 MG tablet Take 1 tablet (100 mg total) by mouth daily. 90 tablet 3   omeprazole (PRILOSEC) 40 MG capsule TAKE 1 CAPSULE(40 MG) BY MOUTH DAILY 90 capsule 3   No facility-administered medications prior to visit.   ROS See HPI  Objective:  BP 136/80 (BP Location: Left Arm, Patient Position: Sitting, Cuff Size: Large)   Pulse 88   Temp (!) 97.3 F (36.3 C) (Temporal)   Ht 5' 11.5" (1.816 m)   Wt 203 lb 9.6 oz (92.4 kg)   SpO2 98%   BMI 28.00 kg/m   Physical Exam Cardiovascular:     Rate and Rhythm: Normal rate and regular rhythm.     Pulses: Normal pulses.     Heart sounds: Normal heart sounds.  Pulmonary:     Effort: Pulmonary effort is normal.     Breath sounds: Normal breath sounds.  Musculoskeletal:     Right lower leg: No edema.     Left lower leg: No edema.  Neurological:     Mental Status: He is alert and oriented to person, place, and time.  Psychiatric:        Mood and Affect: Mood normal.        Behavior: Behavior normal.        Thought Content: Thought content normal.   Assessment & Plan:  This visit occurred during the SARS-CoV-2 public health emergency.  Safety protocols were in place, including screening questions prior to the visit, additional usage of staff PPE, and extensive cleaning of exam room while  observing appropriate contact time as indicated for disinfecting solutions.   Adelfo was seen today for establish care.  Diagnoses and all orders for this visit:  Intermittent palpitations -     ECHOCARDIOGRAM COMPLETE; Future -     Cardiac event monitor; Future  Essential hypertension -     ECHOCARDIOGRAM COMPLETE; Future   Problem List Items Addressed This Visit       Cardiovascular and Mediastinum   Essential hypertension   Relevant Orders   ECHOCARDIOGRAM COMPLETE   Other Visit Diagnoses     Intermittent palpitations    -  Primary   Relevant Orders   ECHOCARDIOGRAM COMPLETE   Cardiac event monitor        Follow-up: No follow-ups on file.  Wilfred Lacy, NP

## 2021-06-15 ENCOUNTER — Ambulatory Visit (HOSPITAL_COMMUNITY)
Admission: RE | Admit: 2021-06-15 | Discharge: 2021-06-15 | Disposition: A | Discharge status: Home / Self Care | Payer: 59 | Source: Ambulatory Visit | Attending: Nurse Practitioner | Admitting: Nurse Practitioner

## 2021-06-15 ENCOUNTER — Other Ambulatory Visit: Payer: Self-pay

## 2021-06-15 DIAGNOSIS — I10 Essential (primary) hypertension: Secondary | ICD-10-CM | POA: Insufficient documentation

## 2021-06-15 DIAGNOSIS — R002 Palpitations: Secondary | ICD-10-CM | POA: Insufficient documentation

## 2021-06-15 LAB — ECHOCARDIOGRAM COMPLETE
Area-P 1/2: 3.48 cm2
Calc EF: 63.8 %
S' Lateral: 3.7 cm
Single Plane A2C EF: 62.4 %
Single Plane A4C EF: 64.5 %

## 2021-06-15 NOTE — Progress Notes (Signed)
  Echocardiogram 2D Echocardiogram has been performed.  Bobbye Charleston 06/15/2021, 8:52 AM

## 2021-06-20 ENCOUNTER — Encounter: Payer: Self-pay | Admitting: Nurse Practitioner

## 2021-06-20 DIAGNOSIS — I10 Essential (primary) hypertension: Secondary | ICD-10-CM

## 2021-06-23 ENCOUNTER — Encounter: Payer: Self-pay | Admitting: Nurse Practitioner

## 2021-06-23 ENCOUNTER — Ambulatory Visit (INDEPENDENT_AMBULATORY_CARE_PROVIDER_SITE_OTHER): Payer: 59

## 2021-06-23 DIAGNOSIS — R002 Palpitations: Secondary | ICD-10-CM

## 2021-06-24 MED ORDER — LOSARTAN POTASSIUM 100 MG PO TABS
100.0000 mg | ORAL_TABLET | Freq: Every day | ORAL | 3 refills | Status: DC
Start: 2021-06-24 — End: 2022-05-02

## 2021-06-25 ENCOUNTER — Encounter (HOSPITAL_COMMUNITY): Payer: Self-pay

## 2021-06-25 ENCOUNTER — Emergency Department (HOSPITAL_COMMUNITY): Payer: 59

## 2021-06-25 ENCOUNTER — Other Ambulatory Visit: Payer: Self-pay

## 2021-06-25 ENCOUNTER — Telehealth: Payer: Self-pay

## 2021-06-25 ENCOUNTER — Observation Stay (HOSPITAL_COMMUNITY)
Admission: EM | Admit: 2021-06-25 | Discharge: 2021-06-25 | Disposition: A | Discharge status: Home / Self Care | Payer: 59 | Attending: Internal Medicine | Admitting: Internal Medicine

## 2021-06-25 ENCOUNTER — Telehealth (INDEPENDENT_AMBULATORY_CARE_PROVIDER_SITE_OTHER): Payer: Self-pay | Admitting: Internal Medicine

## 2021-06-25 DIAGNOSIS — Z20822 Contact with and (suspected) exposure to covid-19: Secondary | ICD-10-CM | POA: Diagnosis not present

## 2021-06-25 DIAGNOSIS — I5189 Other ill-defined heart diseases: Secondary | ICD-10-CM

## 2021-06-25 DIAGNOSIS — I4891 Unspecified atrial fibrillation: Secondary | ICD-10-CM | POA: Diagnosis not present

## 2021-06-25 DIAGNOSIS — I2510 Atherosclerotic heart disease of native coronary artery without angina pectoris: Secondary | ICD-10-CM | POA: Insufficient documentation

## 2021-06-25 DIAGNOSIS — R002 Palpitations: Secondary | ICD-10-CM | POA: Diagnosis present

## 2021-06-25 DIAGNOSIS — G4733 Obstructive sleep apnea (adult) (pediatric): Secondary | ICD-10-CM

## 2021-06-25 DIAGNOSIS — Z8546 Personal history of malignant neoplasm of prostate: Secondary | ICD-10-CM | POA: Insufficient documentation

## 2021-06-25 DIAGNOSIS — J45909 Unspecified asthma, uncomplicated: Secondary | ICD-10-CM | POA: Insufficient documentation

## 2021-06-25 DIAGNOSIS — I10 Essential (primary) hypertension: Secondary | ICD-10-CM

## 2021-06-25 DIAGNOSIS — E785 Hyperlipidemia, unspecified: Secondary | ICD-10-CM | POA: Diagnosis present

## 2021-06-25 DIAGNOSIS — G4730 Sleep apnea, unspecified: Secondary | ICD-10-CM | POA: Diagnosis present

## 2021-06-25 DIAGNOSIS — Z79899 Other long term (current) drug therapy: Secondary | ICD-10-CM | POA: Diagnosis not present

## 2021-06-25 DIAGNOSIS — K2100 Gastro-esophageal reflux disease with esophagitis, without bleeding: Secondary | ICD-10-CM | POA: Diagnosis present

## 2021-06-25 DIAGNOSIS — R739 Hyperglycemia, unspecified: Secondary | ICD-10-CM | POA: Diagnosis present

## 2021-06-25 HISTORY — DX: Sleep apnea, unspecified: G47.30

## 2021-06-25 LAB — COMPREHENSIVE METABOLIC PANEL
ALT: 25 U/L (ref 0–44)
AST: 24 U/L (ref 15–41)
Albumin: 4.3 g/dL (ref 3.5–5.0)
Alkaline Phosphatase: 96 U/L (ref 38–126)
Anion gap: 9 (ref 5–15)
BUN: 18 mg/dL (ref 6–20)
CO2: 26 mmol/L (ref 22–32)
Calcium: 9.9 mg/dL (ref 8.9–10.3)
Chloride: 104 mmol/L (ref 98–111)
Creatinine, Ser: 0.8 mg/dL (ref 0.61–1.24)
GFR, Estimated: >60 mL/min (ref >60)
Glucose, Bld: 123 mg/dL — ABNORMAL HIGH (ref 70–99)
Potassium: 3.9 mmol/L (ref 3.5–5.1)
Sodium: 139 mmol/L (ref 135–145)
Total Bilirubin: 0.6 mg/dL (ref 0.3–1.2)
Total Protein: 7.8 g/dL (ref 6.5–8.1)

## 2021-06-25 LAB — MAGNESIUM: Magnesium: 2.5 mg/dL — ABNORMAL HIGH (ref 1.7–2.4)

## 2021-06-25 LAB — CBC WITH DIFFERENTIAL/PLATELET
Abs Immature Granulocytes: 0.03 10*3/uL (ref 0.00–0.07)
Basophils Absolute: 0.1 10*3/uL (ref 0.0–0.1)
Basophils Relative: 1 %
Eosinophils Absolute: 0.4 10*3/uL (ref 0.0–0.5)
Eosinophils Relative: 6 %
HCT: 44.6 % (ref 39.0–52.0)
Hemoglobin: 15.4 g/dL (ref 13.0–17.0)
Immature Granulocytes: 0 %
Lymphocytes Relative: 40 %
Lymphs Abs: 2.9 10*3/uL (ref 0.7–4.0)
MCH: 30.9 pg (ref 26.0–34.0)
MCHC: 34.5 g/dL (ref 30.0–36.0)
MCV: 89.6 fL (ref 80.0–100.0)
Monocytes Absolute: 0.9 10*3/uL (ref 0.1–1.0)
Monocytes Relative: 12 %
Neutro Abs: 2.9 10*3/uL (ref 1.7–7.7)
Neutrophils Relative %: 41 %
Platelets: 273 10*3/uL (ref 150–400)
RBC: 4.98 MIL/uL (ref 4.22–5.81)
RDW: 12.9 % (ref 11.5–15.5)
WBC: 7.3 10*3/uL (ref 4.0–10.5)
nRBC: 0 % (ref 0.0–0.2)

## 2021-06-25 LAB — RESP PANEL BY RT-PCR (FLU A&B, COVID) ARPGX2
Influenza A by PCR: NEGATIVE
Influenza B by PCR: NEGATIVE
SARS Coronavirus 2 by RT PCR: NEGATIVE

## 2021-06-25 LAB — TROPONIN I (HIGH SENSITIVITY)
Troponin I (High Sensitivity): 14 ng/L (ref <18)
Troponin I (High Sensitivity): 10 ng/L (ref <18)

## 2021-06-25 LAB — LIPID PANEL
Cholesterol: 186 mg/dL (ref 0–200)
HDL: 52 mg/dL (ref >40)
LDL Cholesterol: 98 mg/dL (ref 0–99)
Total CHOL/HDL Ratio: 3.6 RATIO
Triglycerides: 179 mg/dL — ABNORMAL HIGH (ref <150)
VLDL: 36 mg/dL (ref 0–40)

## 2021-06-25 LAB — HEMOGLOBIN A1C
Hgb A1c MFr Bld: 5.9 % — ABNORMAL HIGH (ref 4.8–5.6)
Mean Plasma Glucose: 122.63 mg/dL

## 2021-06-25 LAB — TSH: TSH: 3.342 u[IU]/mL (ref 0.350–4.500)

## 2021-06-25 MED ORDER — POTASSIUM CHLORIDE CRYS ER 20 MEQ PO TBCR
20.0000 meq | EXTENDED_RELEASE_TABLET | Freq: Once | ORAL | Status: AC
  Administered 2021-06-25: 20 meq via ORAL
  Filled 2021-06-25: qty 1

## 2021-06-25 MED ORDER — METOPROLOL TARTRATE 5 MG/5ML IV SOLN
5.0000 mg | Freq: Once | INTRAVENOUS | Status: AC
  Administered 2021-06-25: 5 mg via INTRAVENOUS
  Filled 2021-06-25: qty 5

## 2021-06-25 MED ORDER — PANTOPRAZOLE SODIUM 40 MG PO TBEC
40.0000 mg | DELAYED_RELEASE_TABLET | Freq: Every day | ORAL | Status: DC
  Administered 2021-06-25: 40 mg via ORAL
  Filled 2021-06-25: qty 1

## 2021-06-25 MED ORDER — SODIUM CHLORIDE 0.9 % IV BOLUS
1000.0000 mL | Freq: Once | INTRAVENOUS | Status: AC
  Administered 2021-06-25: 1000 mL via INTRAVENOUS

## 2021-06-25 MED ORDER — LORATADINE 10 MG PO TABS
10.0000 mg | ORAL_TABLET | Freq: Every day | ORAL | Status: DC
  Filled 2021-06-25: qty 1

## 2021-06-25 MED ORDER — HEPARIN BOLUS VIA INFUSION
4000.0000 [IU] | Freq: Once | INTRAVENOUS | Status: AC
  Administered 2021-06-25: 4000 [IU] via INTRAVENOUS
  Filled 2021-06-25: qty 4000

## 2021-06-25 MED ORDER — HYDROCHLOROTHIAZIDE 12.5 MG PO TABS: 12.5000 mg | ORAL_TABLET | Freq: Every day | ORAL | 0 refills | Status: DC

## 2021-06-25 MED ORDER — APIXABAN 5 MG PO TABS
5.0000 mg | ORAL_TABLET | Freq: Two times a day (BID) | ORAL | Status: DC
  Administered 2021-06-25: 5 mg via ORAL
  Filled 2021-06-25 (×2): qty 1

## 2021-06-25 MED ORDER — APIXABAN 5 MG PO TABS: 5.0000 mg | ORAL_TABLET | Freq: Two times a day (BID) | ORAL | 0 refills | Status: DC

## 2021-06-25 MED ORDER — LEVALBUTEROL TARTRATE 45 MCG/ACT IN AERO: 2.0000 | INHALATION_SPRAY | Freq: Four times a day (QID) | RESPIRATORY_TRACT | Status: DC | PRN

## 2021-06-25 MED ORDER — AZELASTINE HCL 0.1 % NA SOLN: 2.0000 | Freq: Two times a day (BID) | NASAL | Status: DC

## 2021-06-25 MED ORDER — LEVALBUTEROL TARTRATE 45 MCG/ACT IN AERO: 2.0000 | INHALATION_SPRAY | Freq: Four times a day (QID) | RESPIRATORY_TRACT | 0 refills | Status: DC | PRN

## 2021-06-25 MED ORDER — DILTIAZEM HCL-DEXTROSE 125-5 MG/125ML-% IV SOLN (PREMIX)
2.5000 mg/h | INTRAVENOUS | Status: DC
Start: 2021-06-25 — End: 2021-06-25
  Administered 2021-06-25: 5 mg/h via INTRAVENOUS
  Filled 2021-06-25: qty 125

## 2021-06-25 MED ORDER — CARVEDILOL 12.5 MG PO TABS
12.5000 mg | ORAL_TABLET | Freq: Two times a day (BID) | ORAL | Status: DC
  Administered 2021-06-25: 12.5 mg via ORAL
  Filled 2021-06-25: qty 1

## 2021-06-25 MED ORDER — HEPARIN (PORCINE) 25000 UT/250ML-% IV SOLN
1200.0000 [IU]/h | INTRAVENOUS | Status: DC
  Administered 2021-06-25: 1200 [IU]/h via INTRAVENOUS
  Filled 2021-06-25: qty 250

## 2021-06-25 MED ORDER — FLUTICASONE PROPIONATE 50 MCG/ACT NA SUSP: 2.0000 | Freq: Every day | NASAL | Status: DC

## 2021-06-25 MED ORDER — DILTIAZEM HCL ER COATED BEADS 180 MG PO CP24
180.0000 mg | ORAL_CAPSULE | Freq: Every day | ORAL | Status: DC
  Administered 2021-06-25: 180 mg via ORAL
  Filled 2021-06-25: qty 1

## 2021-06-25 MED ORDER — HYDROCHLOROTHIAZIDE 12.5 MG PO TABS
25.0000 mg | ORAL_TABLET | Freq: Every day | ORAL | Status: DC
  Administered 2021-06-25: 25 mg via ORAL
  Filled 2021-06-25: qty 2

## 2021-06-25 MED ORDER — DILTIAZEM HCL ER COATED BEADS 180 MG PO CP24: 180.0000 mg | ORAL_CAPSULE | Freq: Every day | ORAL | 0 refills | Status: DC

## 2021-06-25 MED ORDER — LOSARTAN POTASSIUM 25 MG PO TABS
100.0000 mg | ORAL_TABLET | Freq: Every day | ORAL | Status: DC
  Administered 2021-06-25: 100 mg via ORAL
  Filled 2021-06-25: qty 4

## 2021-06-25 NOTE — H&P (Signed)
History and Physical    Clarence Perez YDX:[id number] DOB: [birth year] DOA: 06/25/2021  PCP: Flossie Buffy, NP   Patient coming from: Home.  I have personally briefly reviewed patient's old medical records in [place]  Chief Complaint: Palpitations and nausea.  HPI: Clarence Perez is a 60 y.o. male with medical history significant of mild intermittent asthma, prostate cancer treated with cryoablation, GERD, hypertension, questionable history of sleep apnea not on CPAP (diagnosed more than 10 years ago which may not have been an optimal study as he was having sinus and airway congestion at that moment) who is coming to the emergency department due to palpitations associated with mild dyspnea and nausea.  The patient has had intermittent palpitations for years but it would be 1 or 2 episodes per year.  However, recently they have become more frequent and more symptomatic.  He stated he has some mild orthopnea while trying to get relief lying down plan he was having 1 of these episodes.  He denied chest pain, vomiting or diaphoresis.  He has varicose veins of the lower extremities but no pitting edema or PND.  He went to see his PCP who ordered an echocardiogram and placed on a cardiac monitor which the patient has still on.  He denied fever, chills, sore throat, rhinorrhea, hemoptysis.  He gets occasional heartburn, but no diarrhea, constipation, melena or hematochezia.  No dysuria, flank pain or hematuria.  His blood glucose elevated this morning although it had been 6 hours after his last meal.  No polyuria, polydipsia, polyphagia or blurred vision.  ED Course: Initial vital signs were temperature 98.1 F, pulse 115, respirations 18, BP 123/100 mmHg O2 sat 98% on room air.  The patient was started on a diltiazem infusion after he received 1000 mL of NS bolus.  I added metoprolol tartrate 5 mg IVP and heparin per pharmacy.  Lab work: CBC was normal.  Troponin x2 unremarkable.  CMP  showed a glucose of 123 mg/dL, all other values were within expected range.  Hemoglobin A1c was 5.9% and lipid panel showed triglycerides of 179 mg/dL.  Imaging: A 2 view chest radiograph did not show any evidence of acute chest disease.  Please see images and full radiology report for further details.  Review of Systems: As per HPI otherwise all other systems reviewed and are negative.  Past Medical History:  Diagnosis Date   Asthma    Cancer ([place])    prostate    GERD (gastroesophageal reflux disease)    Hypertension    Sleep apnea    Past Surgical History:  Procedure Laterality Date   prostate cryoblation     Social History  reports that he has never smoked. He has never used smokeless tobacco. He reports current alcohol use. He reports that he does not use drugs.  Allergies  Allergen Reactions   Ace Inhibitors Cough   Family History  Problem Relation Age of Onset   Cancer Mother        colon   Hypertension Mother    Cancer Father        prostate and kidney   CAD Maternal Uncle    Kidney cancer Maternal Grandmother    Prior to Admission medications   Medication Sig Start Date End Date Taking? Authorizing Provider  albuterol (PROVENTIL HFA;VENTOLIN HFA) 108 (90 Base) MCG/ACT inhaler Inhale 1-2 puffs into the lungs every 6 (six) hours as needed. 02/13/17  Yes [provider]  azelastine (ASTELIN) 0.1 % nasal spray  USE 2 SPRAYS IN EACH NOSTRIL TWICE DAILY AS DIRECTED Patient taking differently: Place 2 sprays into both nostrils 2 (two) times daily. 02/16/21  Yes Cirigliano, Mary K, DO  carvedilol (COREG) 12.5 MG tablet TAKE 1 TABLET(12.5 MG) BY MOUTH TWICE DAILY WITH A MEAL Patient taking differently: Take 12.5 mg by mouth 2 (two) times daily with a meal. TAKE 1 TABLET(12.5 MG) BY MOUTH TWICE DAILY WITH A MEAL 02/16/21  Yes Cirigliano, Mary K, DO  cetirizine (ZYRTEC) 10 MG tablet Take 10 mg by mouth daily. 10/20/09  Yes [provider]  diclofenac Sodium  (VOLTAREN) 1 % GEL Apply 2 g topically as needed (sholduer pain). 03/11/15  Yes [provider]  fluticasone (FLONASE) 50 MCG/ACT nasal spray Place 2 sprays into both nostrils daily. 03/23/14  Yes [provider]  hydrochlorothiazide (HYDRODIURIL) 25 MG tablet Take 1 tablet (25 mg total) by mouth daily. 02/16/21  Yes Cirigliano, Mary K, DO  losartan (COZAAR) 100 MG tablet Take 1 tablet (100 mg total) by mouth daily. 06/24/21  Yes Nche, Charlene Brooke, NP  omeprazole (PRILOSEC) 40 MG capsule TAKE 1 CAPSULE(40 MG) BY MOUTH DAILY Patient taking differently: Take 40 mg by mouth daily as needed (reflux / gerd / heartburn). TAKE 1 CAPSULE(40 MG) BY MOUTH DAILY 02/16/21  Yes Ronnald Nian, DO    Physical Exam: Vitals:   06/25/21 0345 06/25/21 0500 06/25/21 0615 06/25/21 0658  BP: (!) 126/91 118/83 (!) 119/99 (!) 137/98  Pulse: (!) 51 82 78 74  Resp: 15 13 14 11   Temp:      TempSrc:      SpO2: 90% 98% 99% 99%   Constitutional: NAD, calm, comfortable Eyes: Mild peripalpebral erythema bilaterally.  PERRL, lids and conjunctivae normal ENMT: Mucous membranes are moist. Posterior pharynx clear of any exudate or lesions. Neck: normal, supple, no masses, no thyromegaly Respiratory: clear to auscultation bilaterally, no wheezing, no crackles. Normal respiratory effort. No accessory muscle use.  Cardiovascular: Tachycardic in the 120s and 130s with an irregularly irregular rhythm.  No murmurs / rubs / gallops. No extremity edema. 2+ pedal pulses. No carotid bruits.  Abdomen: no tenderness, no masses palpated. No hepatosplenomegaly. Bowel sounds positive.  Musculoskeletal: no clubbing / cyanosis. Good ROM, no contractures. Normal muscle tone.  Skin: Scattered hyperpigmented macules on upper and mid back. Neurologic: CN 2-12 grossly intact. Sensation intact, DTR normal. Strength 5/5 in all 4.  Psychiatric: Normal judgment and insight. Alert and oriented x 3. Normal mood.   Labs on Admission:  I have personally reviewed following labs and imaging studies  CBC: Recent Labs  Lab 06/25/21 0409  WBC 7.3  NEUTROABS 2.9  HGB 15.4  HCT 44.6  MCV 89.6  PLT 979    Basic Metabolic Panel: Recent Labs  Lab 06/25/21 0409  NA 139  K 3.9  CL 104  CO2 26  GLUCOSE 123*  BUN 18  CREATININE 0.80  CALCIUM 9.9  MG 2.5*    GFR: Estimated Creatinine Clearance: 115.1 mL/min (by C-G formula based on SCr of 0.8 mg/dL).  Liver Function Tests: Recent Labs  Lab 06/25/21 0409  AST 24  ALT 25  ALKPHOS 96  BILITOT 0.6  PROT 7.8  ALBUMIN 4.3   Radiological Exams on Admission: DG Chest 2 View  Result Date: 06/25/2021 CLINICAL DATA:  Palpitations, nausea, hypertension. EXAM: CHEST - 2 VIEW COMPARISON:  None. FINDINGS: The heart size and mediastinal contours are within normal limits. Both lungs are clear. The visualized skeletal structures  are unremarkable. Electronic device is seen in the midline overlying the anterior upper chest and there are multiple overlying monitor wires. IMPRESSION: No evidence of acute chest disease. Electronically Signed   By: Telford Nab M.D.   On: 06/25/2021 06:05    06/13/2021 echocardiogram IMPRESSIONS    1. Left ventricular ejection fraction by 3D volume is 67 %. The left  ventricle has normal function. The left ventricle has no regional wall  motion abnormalities. There is mild asymmetric left ventricular  hypertrophy of the posterior-lateral segment.  Left ventricular diastolic parameters are consistent with Grade I  diastolic dysfunction (impaired relaxation). The average left ventricular  global longitudinal strain is -23.3 %. The global longitudinal strain is  normal.   2. Right ventricular systolic function is normal. The right ventricular  size is normal. Tricuspid regurgitation signal is inadequate for assessing  PA pressure.   3. The mitral valve is normal in structure. Trivial mitral valve  regurgitation. No evidence of mitral  stenosis.   4. The aortic valve is tricuspid. Aortic valve regurgitation is not  visualized. No aortic stenosis is present.   5. The inferior vena cava is normal in size with greater than 50%  respiratory variability, suggesting right atrial pressure of 3 mmHg.    EKG: Independently reviewed.  Vent. rate 129 BPM PR interval * ms QRS duration 88 ms QT/QTcB 284/416 ms P-R-T axes * 24 151 Atrial fibrillation Abnormal R-wave progression, early transition Repol abnrm suggests ischemia, lateral leads  Assessment/Plan Principal Problem:   Atrial fibrillation with rapid ventricular response (HCC) CHA?DS?-VASc Score of at least 2. (HTN and grade 1 diastolic dysfunction with preserved EF). Patient high risk for CVD" so score may be higher. Started on heparin while waiting for cardiology evaluation. Will likely need DOAC on discharge. Continue Cardizem infusion and transition to p.o. Continue carvedilol 12.5 mg p.o. twice daily. Keep electrolytes optimized. Cardiology consult appreciated.  Active Problems:   Grade I diastolic dysfunction No signs of decompensation. Continue beta-blocker, ARB and diuretic.    Essential hypertension Continue losartan 100 mg p.o. daily. Continue carvedilol 12.5 mg p.o. twice daily. He will be started on oral diltiazem.    Hyperlipidemia Currently not on medical therapy. Lipid panel show elevated triglycerides.    Gastro-esophageal reflux disease with esophagitis Rest home PPI. Consider GI evaluation as an outpatient.    Asthma Fluids switch albuterol MDI to levalbuterol.    Sleep apnea Needs outpatient sleep study. Follow-up with PCP precordium A. fib.    Hyperglycemia Check hemoglobin A1c. Hemoglobin A1c of 5.9%. Need diet modification.    Hyperpigmented macules The patient will keep photographic record of them. Consult with dermatology for surveillance as an outpatient.   DVT prophylaxis: On heparin infusion. Code Status:   Full  code. Family Communication:   Disposition Plan:   Patient is from:  Home.  Anticipated DC to:  Home.  Anticipated DC date:  06/26/2021.  Anticipated DC barriers: Clinical status.  Consults called:  Cardiology has seen the patient. Admission status:  Observation/PCU.   Severity of Illness: High severity after presenting with palpitations associated with nausea, mild dyspnea and weakness in the setting of atrial fibrillation with RVR.  The patient was started on NP coagulation and diltiazem infusion for rate control.  Reubin Milan MD [facility]  How to contact the [facility] Attending or Consulting provider [place] or covering provider during after hours [place], for this patient?   Check the care team in [facility] and  look for a) attending/consulting [facility] provider listed and b) the [facility] team listed Log into [email or web address] and use [place]'s universal password to access. If you do not have the password, please contact the hospital operator. Locate the [facility] provider you are looking for under [facility] and page to a number that you can be directly reached. If you still have difficulty reaching the provider, please page the [facility] (Director on Call) for the Hospitalists listed on amion for assistance.  06/25/2021, 9:03 AM   This document was prepared using [employer] and may contain some unintended transcription errors.

## 2021-06-25 NOTE — ED Provider Notes (Signed)
[place] DEPT Provider Note  CSN: [id number] Arrival date & time: 06/25/21 0319  Chief Complaint(s) Palpitations  HPI Clarence Perez is a 60 y.o. male    Palpitations Palpitations quality:  Irregular Onset quality:  Sudden Duration: several years intermittently, more frequent over the last few months. Currently has a heart monitor. Chronicity:  Recurrent Context: not anxiety, not dehydration, not exercise, not hyperventilation, not illicit drugs and not stimulant use   Relieved by: self resolve after several hours. Worsened by:  Nothing Associated symptoms: dizziness and nausea   Associated symptoms: no chest pain, no chest pressure, no shortness of breath and no vomiting   Risk factors: no diabetes mellitus, no hx of atrial fibrillation, no hx of DVT and no hx of thyroid disease    Patient reports checking his HR on his home [place] that noted possible A. Fib.  He is unsure of when he was in NSR last.  Past Medical History Past Medical History:  Diagnosis Date   Asthma    Cancer ([place])    prostate    GERD (gastroesophageal reflux disease)    Hypertension    Patient Active Problem List   Diagnosis Date Noted   Well adult exam 11/04/2018   Essential hypertension 11/04/2018   Hyperlipidemia 11/04/2018   Gastro-esophageal reflux disease with esophagitis 12/04/2012   History of prostate cancer 06/02/2012   Asthma 09/20/2011   Home Medication(s) Prior to Admission medications   Medication Sig Start Date End Date Taking? Authorizing Provider  albuterol (PROVENTIL HFA;VENTOLIN HFA) 108 (90 Base) MCG/ACT inhaler Inhale into the lungs. 02/13/17   [provider]  azelastine (ASTELIN) 0.1 % nasal spray USE 2 SPRAYS IN EACH NOSTRIL TWICE DAILY AS DIRECTED 02/16/21   Cirigliano, Mary K, DO  carvedilol (COREG) 12.5 MG tablet TAKE 1 TABLET(12.5 MG) BY MOUTH TWICE DAILY WITH A MEAL 02/16/21   Cirigliano, Mary K, DO  cetirizine (ZYRTEC) 10 MG  tablet Take by mouth. 10/20/09   [provider]  diclofenac Sodium (VOLTAREN) 1 % GEL 2 gm applied to each shoulder 1-2 times daily 03/11/15   [provider]  fluticasone (FLONASE) 50 MCG/ACT nasal spray USE ONE SPRAY IN EACH NOSTRIL TWO TIMES A DAY 03/23/14   [provider]  hydrochlorothiazide (HYDRODIURIL) 25 MG tablet Take 1 tablet (25 mg total) by mouth daily. 02/16/21   Cirigliano, Garvin Fila, DO  losartan (COZAAR) 100 MG tablet Take 1 tablet (100 mg total) by mouth daily. 06/24/21   Nche, Charlene Brooke, NP  omeprazole (PRILOSEC) 40 MG capsule TAKE 1 CAPSULE(40 MG) BY MOUTH DAILY 02/16/21   Ronnald Nian, DO                                                                                                                                    Past Surgical History Past Surgical History:  Procedure Laterality Date   prostate cryoblation  Family History Family History  Problem Relation Age of Onset   Cancer Mother        colon   Cancer Father        prostate and kidney    Social History Social History   Tobacco Use   Smoking status: Never   Smokeless tobacco: Never  Vaping Use   Vaping Use: Never used  Substance Use Topics   Alcohol use: Yes    Comment: socially   Drug use: Never   Allergies Ace inhibitors  Review of Systems Review of Systems  Respiratory:  Negative for shortness of breath.   Cardiovascular:  Positive for palpitations. Negative for chest pain.  Gastrointestinal:  Positive for nausea. Negative for vomiting.  Neurological:  Positive for dizziness.  All other systems are reviewed and are negative for acute change except as noted in the HPI  Physical Exam Vital Signs  I have reviewed the triage vital signs BP (!) 126/91   Pulse (!) 51   Temp 98.1 F (36.7 C) (Oral)   Resp 15   SpO2 90%   Physical Exam Vitals reviewed.  Constitutional:      General: He is not in acute distress.    Appearance: He is well-developed. He is not  diaphoretic.  HENT:     Head: Normocephalic and atraumatic.     Nose: Nose normal.  Eyes:     General: No scleral icterus.       Right eye: No discharge.        Left eye: No discharge.     Conjunctiva/sclera: Conjunctivae normal.     Pupils: Pupils are equal, round, and reactive to light.  Cardiovascular:     Rate and Rhythm: Tachycardia present. Rhythm irregularly irregular.     Heart sounds: No murmur heard.   No friction rub. No gallop.  Pulmonary:     Effort: Pulmonary effort is normal. No respiratory distress.     Breath sounds: Normal breath sounds. No stridor. No rales.  Abdominal:     General: There is no distension.     Palpations: Abdomen is soft.     Tenderness: There is no abdominal tenderness.  Musculoskeletal:        General: No tenderness.     Cervical back: Normal range of motion and neck supple.  Skin:    General: Skin is warm and dry.     Findings: No erythema or rash.  Neurological:     Mental Status: He is alert and oriented to person, place, and time.    ED Results and Treatments Labs (all labs ordered are listed, but only abnormal results are displayed) Labs Reviewed  COMPREHENSIVE METABOLIC PANEL - Abnormal; Notable for the following components:      Result Value   Glucose, Bld 123 (*)    All other components within normal limits  MAGNESIUM - Abnormal; Notable for the following components:   Magnesium 2.5 (*)    All other components within normal limits  RESP PANEL BY RT-PCR (FLU A&B, COVID) ARPGX2  CBC WITH DIFFERENTIAL/PLATELET  TROPONIN I (HIGH SENSITIVITY)  TROPONIN I (HIGH SENSITIVITY)  EKG  EKG Interpretation  Date/Time:  Monday June 25 2021 03:38:01 EST Ventricular Rate:  129 PR Interval:    QRS Duration: 88 QT Interval:  284 QTC Calculation: 416 R Axis:   24 Text Interpretation: Atrial fibrillation Abnormal  R-wave progression, early transition Repol abnrm suggests ischemia, lateral leads Confirmed by Addison Lank [phone]) on 06/25/2021 4:10:52 AM       Radiology No results found.  Pertinent labs & imaging results that were available during my care of the patient were reviewed by me and considered in my medical decision making (see MDM for details).  Medications Ordered in ED Medications  diltiazem (CARDIZEM) 125 mg in dextrose 5% 125 mL (1 mg/mL) infusion (has no administration in time range)  sodium chloride 0.9 % bolus 1,000 mL (1,000 mLs Intravenous New Bag/Given 06/25/21 0500)                                                                                                                                     Procedures .1-3 Lead EKG Interpretation Performed by: Fatima Blank, MD Authorized by: Fatima Blank, MD     Interpretation: abnormal     ECG rate:  115-144   ECG rate assessment: tachycardic     Rhythm: atrial fibrillation     Ectopy: none     Conduction: normal   .Critical Care Performed by: Fatima Blank, MD Authorized by: Fatima Blank, MD   Critical care provider statement:    Critical care time (minutes):  45   Critical care time was exclusive of:  Separately billable procedures and treating other patients   Critical care was necessary to treat or prevent imminent or life-threatening deterioration of the following conditions:  Cardiac failure   Critical care was time spent personally by me on the following activities:  Development of treatment plan with patient or surrogate, discussions with consultants, evaluation of patient's response to treatment, examination of patient, obtaining history from patient or surrogate, review of old charts, re-evaluation of patient's condition, pulse oximetry, ordering and review of radiographic studies, ordering and review of laboratory studies and ordering and performing treatments and interventions    Care discussed with: admitting provider    (including critical care time)  Medical Decision Making / ED Course I have reviewed the nursing notes for this encounter and the patient's prior records (if available in EHR or on provided paperwork).  Clarence Perez was evaluated in Emergency Department on 06/25/2021 for the symptoms described in the history of present illness. He was evaluated in the context of the global COVID-19 pandemic, which necessitated consideration that the patient might be at risk for infection with the SARS-CoV-2 virus that causes COVID-19. Institutional protocols and algorithms that pertain to the evaluation of patients at risk for COVID-19 are in a state of rapid change based on information released by regulatory bodies including the CDC and federal and state organizations. These  policies and algorithms were followed during the patient's care in the ED.     AFIB RVR. Unsure of actual AFib onset. Not a candidate for electrical cardioversion in the ED. Will get screening labs and provide with IVF. Monitor for self conversion. Treat with diltiazem if no conversion.  On review of records patient has had recent TSH that was within normal limits.  Patient  Pertinent labs & imaging results that were available during my care of the patient were reviewed by me and considered in my medical decision making:  Labs are grossly reassuring without leukocytosis or anemia.  No significant electrolyte derangement or renal sufficiency. After IV fluids patient still in A. fib with frequent bouts of RVR Placed on dilt drip. Admitted to medicine for further management.  Final Clinical Impression(s) / ED Diagnoses Final diagnoses:  Atrial fibrillation with RVR ([place])     This chart was dictated using voice recognition software.  Despite best efforts to proofread,  errors can occur which can change the documentation meaning.    Fatima Blank, MD 06/25/21 [phone]

## 2021-06-25 NOTE — Telephone Encounter (Signed)
   Cardiac Monitor Alert  Date of alert:  06/25/2021   Patient Name: Clarence Perez  DOB: [birth year]  MRN: [id number]   [facility] Cardiologist: None  [facility] EP:  None    Monitor Information: Cardiac Event Monitor [[employer]]  Reason:  palpitations  Ordering provider:  Baldo Ash Lum-Nche,NP   Alert Atrial Fibrillation/Flutter This is the 1st alert for this rhythm.  The patient has no hx of Atrial Fibrillation/Flutter.  The patient is not currently on anticoagulation.  Next Cardiology Appointment   ED provider has requested referral to Afib clnic  The patient could NOT be reached by telephone today.  Pt currently in ED at [facility] The patient was referred to the [facility].  Appt date/time:  Not yet scheduled    Reveiwed with Dr Vic Ripper, RN  06/25/2021 11:11 AM

## 2021-06-25 NOTE — Progress Notes (Signed)
ANTICOAGULATION CONSULT NOTE   Pharmacy Consult for Heparin >> apixaban Indication: atrial fibrillation  Allergies  Allergen Reactions   Ace Inhibitors Cough    Patient Measurements:   Heparin Dosing Weight: 92.4kg  Vital Signs: Temp: 98.1 F (36.7 C) (11/07 0338) Temp Source: Oral (11/07 0338) BP: 124/73 (11/07 0900) Pulse Rate: 84 (11/07 0915)  Labs: Recent Labs    06/25/21 0409 06/25/21 0657  HGB 15.4  --   HCT 44.6  --   PLT 273  --   CREATININE 0.80  --   TROPONINIHS 14 10    Estimated Creatinine Clearance: 115.1 mL/min (by C-G formula based on SCr of 0.8 mg/dL).  Medical History: Past Medical History:  Diagnosis Date   Asthma    Cancer ([place])    prostate    GERD (gastroesophageal reflux disease)    Hypertension    Sleep apnea    Medications:  Scheduled:   apixaban  5 mg Oral BID   azelastine  2 spray Each Nare BID   carvedilol  12.5 mg Oral BID WC   fluticasone  2 spray Each Nare Daily   hydrochlorothiazide  25 mg Oral Daily   loratadine  10 mg Oral Daily   losartan  100 mg Oral Daily   pantoprazole  40 mg Oral Daily   Infusions:   diltiazem (CARDIZEM) infusion 7.5 mg/hr (06/25/21 0845)    Assessment: 2 yoM - Afib with RVR, 10/26 Cards office note for palpitations:, TTE 10/28: EF 67%, currently on Holter monitor > to ED with possible Afib per monitor. Hx several yr intermittent palpitations. Baseline Hgb 15gm/dl, Plt 273. No anti-coagulation PTA  AM UPDATE: pharmacy consulted to transition heparin drip to Eliquis  Goal of Therapy:  Monitor platelets by anticoagulation protocol: Yes   Plan:  Discontinue heparin drip Initiate Eliquis 5mg  PO BID at time of heparin discontinuation Relayed to RN  Dimple Nanas, PharmD 06/25/2021 11:47 AM

## 2021-06-25 NOTE — Discharge Summary (Signed)
Physician Discharge Summary  Clarence Perez JQB:[id number] DOB: 13-Sep-1960 DOA: 06/25/2021  PCP: Flossie Buffy, NP  Admit date: 06/25/2021 Discharge date: 06/25/2021  Admitted From: Home. Disposition: Home.  Recommendations for Outpatient Follow-up:  Follow up with PCP in 1-2 weeks Please obtain BMP/CBC in one week Please follow up on the following pending results:  Home Health: No. Equipment/Devices: No. Discharge Condition: Stable. CODE STATUS: Full. Diet recommendation: Heart Healthy / Carb Modified.  Brief/Interim Summary: Clarence Perez is a 60 y.o. male with medical history significant of mild intermittent asthma, prostate cancer treated with cryoablation, GERD, hypertension, questionable history of sleep apnea not on CPAP (diagnosed more than 10 years ago which may not have been an optimal study as he was having sinus and airway congestion at that moment) who is coming to the emergency department due to palpitations associated with mild dyspnea and nausea.  The patient has had intermittent palpitations for years but it would be 1 or 2 episodes per year.  However, recently they have become more frequent and more symptomatic.  He stated he has some mild orthopnea while trying to get relief lying down plan he was having 1 of these episodes.  He denied chest pain, vomiting or diaphoresis.  He has varicose veins of the lower extremities but no pitting edema or PND.  He went to see his PCP who ordered an echocardiogram and placed on a cardiac monitor which the patient has still on.  He denied fever, chills, sore throat, rhinorrhea, hemoptysis.  He gets occasional heartburn, but no diarrhea, constipation, melena or hematochezia.  No dysuria, flank pain or hematuria.  His blood glucose elevated this morning although it had been 6 hours after his last meal.  No polyuria, polydipsia, polyphagia or blurred vision.  ED Course: Initial vital signs were temperature 98.1 F, pulse 115,  respirations 18, BP 123/100 mmHg O2 sat 98% on room air.  The patient was started on a diltiazem infusion after he received 1000 mL of NS bolus.  I added metoprolol tartrate 5 mg IVP and heparin per pharmacy.  His heart rate was subsequently controlled.  He was seen by cardiology who arranged for him to follow-up  Discharge Diagnoses:  Principal Problem: Atrial fibrillation with rapid ventricular response (HCC) CHA?DS?-VASc Score of at least 2. (HTN and grade 1 diastolic dysfunction with preserved EF). Patient high risk for CVD" so score may be higher. Started on heparin while waiting for cardiology evaluation. Will likely need DOAC on discharge. Continue Cardizem infusion and transition to p.o. Continue carvedilol 12.5 mg p.o. twice daily. Keep electrolytes optimized. Cardiology consult appreciated.   Active Problems:   Grade I diastolic dysfunction No signs of decompensation. Continue beta-blocker, ARB and diuretic.     Essential hypertension Continue losartan 100 mg p.o. daily. Continue carvedilol 12.5 mg p.o. twice daily. He will be started on oral diltiazem.     Hyperlipidemia Currently not on medical therapy. Lipid panel show elevated triglycerides.     Gastro-esophageal reflux disease with esophagitis Rest home PPI. Consider GI evaluation as an outpatient.     Asthma Fluids switch albuterol MDI to levalbuterol.     Sleep apnea Needs outpatient sleep study. Follow-up with PCP precordium A. fib.     Hyperglycemia Check hemoglobin A1c. Hemoglobin A1c of 5.9%. Need diet modification.     Hyperpigmented macules The patient will keep photographic record of them. Consult with dermatology for surveillance as an outpatient.  Discharge Instructions: Follow-up with PCP at earliest convenience. Follow-up at the  A. fib clinic as instructed by the cardiology team. Discharge Instructions     Amb referral to AFIB Clinic   Complete by: As directed       Allergies as  of 06/25/2021       Reactions   Ace Inhibitors Cough        Medication List     STOP taking these medications    albuterol 108 (90 Base) MCG/ACT inhaler Commonly known as: VENTOLIN HFA       TAKE these medications    apixaban 5 MG Tabs tablet Commonly known as: ELIQUIS Take 1 tablet (5 mg total) by mouth 2 (two) times daily.   azelastine 0.1 % nasal spray Commonly known as: ASTELIN USE 2 SPRAYS IN EACH NOSTRIL TWICE DAILY AS DIRECTED What changed:  how much to take how to take this when to take this additional instructions   carvedilol 12.5 MG tablet Commonly known as: COREG TAKE 1 TABLET(12.5 MG) BY MOUTH TWICE DAILY WITH A MEAL What changed:  how much to take how to take this when to take this   diclofenac Sodium 1 % Gel Commonly known as: VOLTAREN Apply 2 g topically as needed (sholduer pain).   diltiazem 180 MG 24 hr capsule Commonly known as: CARDIZEM CD Take 1 capsule (180 mg total) by mouth daily.   fluticasone 50 MCG/ACT nasal spray Commonly known as: FLONASE Place 2 sprays into both nostrils daily.   hydrochlorothiazide 12.5 MG tablet Commonly known as: HYDRODIURIL Take 1 tablet (12.5 mg total) by mouth daily. What changed:  medication strength how much to take   levalbuterol 45 MCG/ACT inhaler Commonly known as: XOPENEX HFA Inhale 2 puffs into the lungs every 6 (six) hours as needed for wheezing.   losartan 100 MG tablet Commonly known as: COZAAR Take 1 tablet (100 mg total) by mouth daily.   omeprazole 40 MG capsule Commonly known as: PRILOSEC TAKE 1 CAPSULE(40 MG) BY MOUTH DAILY What changed:  how much to take how to take this when to take this reasons to take this       ASK your doctor about these medications    cetirizine 10 MG tablet Commonly known as: ZYRTEC Take 10 mg by mouth daily.        Follow-up Information     [facility] Follow up on 07/02/2021.   Specialty: Cardiology Why:  Please arrive 15 mintutes early for your 10:30am Afib clinic appointment with Ricky (Clint) [name], PA-C.  The Afib clinic is in the [facility] at [facility] accessible from [facility], entrance C. The parking code is Engineer, manufacturing information: [address] [id number] [place] [address] [phone]               Allergies  Allergen Reactions   Ace Inhibitors Cough   Consultations: Cardiology (Dr. Lyman Bishop)  Procedures/Studies: DG Chest 2 View  Result Date: 06/25/2021 CLINICAL DATA:  Palpitations, nausea, hypertension. EXAM: CHEST - 2 VIEW COMPARISON:  None. FINDINGS: The heart size and mediastinal contours are within normal limits. Both lungs are clear. The visualized skeletal structures are unremarkable. Electronic device is seen in the midline overlying the anterior upper chest and there are multiple overlying monitor wires. IMPRESSION: No evidence of acute chest disease. Electronically Signed   By: Telford Nab M.D.   On: 06/25/2021 06:05   ECHOCARDIOGRAM COMPLETE  Result Date: 06/15/2021    ECHOCARDIOGRAM REPORT   Patient Name:   KAGE WILLMANN  Date of Exam: 06/15/2021 Medical Rec #:  [id number]       Height:       71.5 in Accession #:    [id number]      Weight:       203.6 lb Date of Birth:  08-31-1960      BSA:          2.136 m Patient Age:    60 years        BP:           157/79 mmHg Patient Gender: M               HR:           83 bpm. Exam Location:  Outpatient Procedure: 2D Echo, 3D Echo, Cardiac Doppler, Color Doppler and Strain Analysis Indications:    R00.2 Palpitations; I10 Hypertension  History:        Patient has no prior history of Echocardiogram examinations.                 Abnormal ECG; Risk Factors:Dyslipidemia.  Sonographer:    Roseanna Rainbow RDCS Referring Phys: [id number] CHARLOTTE LUM NCHE IMPRESSIONS  1. Left ventricular ejection fraction by 3D volume is 67 %. The left ventricle has normal function. The left  ventricle has no regional wall motion abnormalities. There is mild asymmetric left ventricular hypertrophy of the posterior-lateral segment. Left ventricular diastolic parameters are consistent with Grade I diastolic dysfunction (impaired relaxation). The average left ventricular global longitudinal strain is -23.3 %. The global longitudinal strain is normal.  2. Right ventricular systolic function is normal. The right ventricular size is normal. Tricuspid regurgitation signal is inadequate for assessing PA pressure.  3. The mitral valve is normal in structure. Trivial mitral valve regurgitation. No evidence of mitral stenosis.  4. The aortic valve is tricuspid. Aortic valve regurgitation is not visualized. No aortic stenosis is present.  5. The inferior vena cava is normal in size with greater than 50% respiratory variability, suggesting right atrial pressure of 3 mmHg. 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 Left ventricular ejection fraction by 3D volume is 67 %. The left ventricle has normal function. The left ventricle has no regional wall motion abnormalities. The average left ventricular global longitudinal strain is -23.3 %. The global longitudinal strain is normal. The left ventricular internal cavity size was normal in size. There is mild asymmetric left ventricular hypertrophy of the posterior-lateral segment. Left ventricular diastolic parameters are consistent with Grade I diastolic dysfunction (impaired relaxation). Right Ventricle: The right ventricular size is normal. No increase in right ventricular wall thickness. Right ventricular systolic function is normal. Tricuspid regurgitation signal is inadequate for assessing PA pressure. Left Atrium: Left atrial size was normal in size. Right Atrium: Right atrial size was normal in size. Pericardium: There is no evidence of pericardial effusion. Mitral Valve: The mitral valve is normal in structure. Trivial mitral valve  regurgitation. No evidence of mitral valve stenosis. Tricuspid Valve: The tricuspid valve is normal in structure. Tricuspid valve regurgitation is trivial. No evidence of tricuspid stenosis. Aortic Valve: The aortic valve is tricuspid. Aortic valve regurgitation is not visualized. No aortic stenosis is present. Pulmonic Valve: The pulmonic valve was normal in structure. Pulmonic valve regurgitation is trivial. No evidence of pulmonic stenosis. Aorta: The aortic root is normal in size and structure. Venous: The inferior vena cava is normal in size with greater than 50% respiratory variability, suggesting right atrial pressure of 3 mmHg. IAS/Shunts:  No atrial level shunt detected by color flow Doppler.  LEFT VENTRICLE PLAX 2D LVIDd:         5.30 cm         Diastology LVIDs:         3.70 cm         LV e' medial:    4.95 cm/s LV PW:         1.10 cm         LV E/e' medial:  9.8 LV IVS:        1.00 cm         LV e' lateral:   7.87 cm/s LVOT diam:     2.20 cm         LV E/e' lateral: 6.1 LV SV:         74 LV SV Index:   35              2D LVOT Area:     3.80 cm        Longitudinal                                Strain                                2D Strain GLS  -23.3 % LV Volumes (MOD)               Avg: LV vol d, MOD    67.5 ml A2C:                           3D Volume EF LV vol d, MOD    84.4 ml       LV 3D EF:    Left A4C:                                        ventricul LV vol s, MOD    25.4 ml                    ar A2C:                                        ejection LV vol s, MOD    30.0 ml                    fraction A4C:                                        by 3D LV SV MOD A2C:   42.1 ml                    volume is LV SV MOD A4C:   84.4 ml                    67 %. LV SV MOD BP:    48.9 ml  3D Volume EF:                                3D EF:        67 %                                LV EDV:       166 ml                                LV ESV:       54 ml                                 LV SV:        112 ml RIGHT VENTRICLE             IVC RV S prime:     14.40 cm/s  IVC diam: 1.90 cm TAPSE (M-mode): 2.6 cm LEFT ATRIUM           Index        RIGHT ATRIUM           Index LA diam:      3.40 cm 1.59 cm/m   RA Area:     13.60 cm LA Vol (A2C): 35.4 ml 16.58 ml/m  RA Volume:   26.90 ml  12.60 ml/m LA Vol (A4C): 37.7 ml 17.65 ml/m  AORTIC VALVE LVOT Vmax:   102.00 cm/s LVOT Vmean:  68.400 cm/s LVOT VTI:    0.194 m  AORTA Ao Root diam: 3.70 cm Ao Asc diam:  3.70 cm MITRAL VALVE MV Area (PHT): 3.48 cm    SHUNTS MV Decel Time: 218 msec    Systemic VTI:  0.19 m MV E velocity: 48.40 cm/s  Systemic Diam: 2.20 cm MV A velocity: 64.30 cm/s MV E/A ratio:  0.75 Cherlynn Kaiser MD Electronically signed by Cherlynn Kaiser MD Signature Date/Time: 06/15/2021/12:12:55 PM    Final     Subjective: No chest pain, palpitations or dyspnea.  Discharge Exam: Vitals:   06/25/21 0915 06/25/21 1200  BP:  103/81  Pulse: 84 (!) 102  Resp: 18 20  Temp:    SpO2: 99% 95%   General: Pt is alert, awake, not in acute distress Cardiovascular: RRR, S1/S2 +, no rubs, no gallops Respiratory: CTA bilaterally, no wheezing, no rhonchi Abdominal: Soft, NT, ND, bowel sounds + Extremities: no edema, no cyanosis  The results of significant diagnostics from this hospitalization (including imaging, microbiology, ancillary and laboratory) are listed below for reference.    Microbiology: Recent Results (from the past 240 hour(s))  Resp Panel by RT-PCR (Flu A&B, Covid) Nasopharyngeal Swab     Status: None   Collection Time: 06/25/21  5:29 AM   Specimen: Nasopharyngeal Swab; Nasopharyngeal(NP) swabs in vial transport medium  Result Value Ref Range Status   SARS Coronavirus 2 by RT PCR NEGATIVE NEGATIVE Final    Comment: (NOTE) SARS-CoV-2 target nucleic acids are NOT DETECTED.  The SARS-CoV-2 RNA is generally detectable in upper respiratory specimens during the acute phase of infection. The lowest concentration of  SARS-CoV-2 viral copies this assay can detect is 138 copies/mL. A negative result does not preclude SARS-Cov-2 infection and should not be used as the sole basis  for treatment or other patient management decisions. A negative result may occur with  improper specimen collection/handling, submission of specimen other than nasopharyngeal swab, presence of viral mutation(s) within the areas targeted by this assay, and inadequate number of viral copies(<138 copies/mL). A negative result must be combined with clinical observations, patient history, and epidemiological information. The expected result is Negative.  Fact Sheet for Patients:  EntrepreneurPulse.com.au  Fact Sheet for Healthcare Providers:  IncredibleEmployment.be  This test is no t yet approved or cleared by the [place] FDA and  has been authorized for detection and/or diagnosis of SARS-CoV-2 by FDA under an Emergency Use Authorization (EUA). This EUA will remain  in effect (meaning this test can be used) for the duration of the COVID-19 declaration under Section 564(b)(1) of the Act, 21 U.S.C.section 360bbb-3(b)(1), unless the authorization is terminated  or revoked sooner.       Influenza A by PCR NEGATIVE NEGATIVE Final   Influenza B by PCR NEGATIVE NEGATIVE Final    Comment: (NOTE) The Xpert Xpress SARS-CoV-2/FLU/RSV plus assay is intended as an aid in the diagnosis of influenza from Nasopharyngeal swab specimens and should not be used as a sole basis for treatment. Nasal washings and aspirates are unacceptable for Xpert Xpress SARS-CoV-2/FLU/RSV testing.  Fact Sheet for Patients: EntrepreneurPulse.com.au  Fact Sheet for Healthcare Providers: IncredibleEmployment.be  This test is not yet approved or cleared by the [place] FDA and has been authorized for detection and/or diagnosis of SARS-CoV-2 by FDA under an Emergency Use  Authorization (EUA). This EUA will remain in effect (meaning this test can be used) for the duration of the COVID-19 declaration under Section 564(b)(1) of the Act, 21 U.S.C. section 360bbb-3(b)(1), unless the authorization is terminated or revoked.  Performed at [facility], [place] [address], [place], [place] [address]     Labs: BNP (last 3 results) No results for input(s): BNP in the last 8760 hours. Basic Metabolic Panel: Recent Labs  Lab 06/25/21 0409  NA 139  K 3.9  CL 104  CO2 26  GLUCOSE 123*  BUN 18  CREATININE 0.80  CALCIUM 9.9  MG 2.5*   Liver Function Tests: Recent Labs  Lab 06/25/21 0409  AST 24  ALT 25  ALKPHOS 96  BILITOT 0.6  PROT 7.8  ALBUMIN 4.3   No results for input(s): LIPASE, AMYLASE in the last 168 hours. No results for input(s): AMMONIA in the last 168 hours. CBC: Recent Labs  Lab 06/25/21 0409  WBC 7.3  NEUTROABS 2.9  HGB 15.4  HCT 44.6  MCV 89.6  PLT 273   Cardiac Enzymes: No results for input(s): CKTOTAL, CKMB, CKMBINDEX, TROPONINI in the last 168 hours. BNP: Invalid input(s): POCBNP CBG: No results for input(s): GLUCAP in the last 168 hours. D-Dimer No results for input(s): DDIMER in the last 72 hours. Hgb A1c Recent Labs    06/25/21 0409  HGBA1C 5.9*   Lipid Profile Recent Labs    06/25/21 0409  CHOL 186  HDL 52  LDLCALC 98  TRIG 179*  CHOLHDL 3.6   Thyroid function studies Recent Labs    06/25/21 1200  TSH 3.342   Anemia work up No results for input(s): VITAMINB12, FOLATE, FERRITIN, TIBC, IRON, RETICCTPCT in the last 72 hours. Urinalysis    Component Value Date/Time   BILIRUBINUR NEGATIVE 11/04/2018 1426   PROTEINUR Negative 11/04/2018 1426   UROBILINOGEN 0.2 11/04/2018 1426   NITRITE NEGATIVE 11/04/2018 1426   LEUKOCYTESUR Negative 11/04/2018 1426  Sepsis Labs Invalid input(s): PROCALCITONIN,  WBC,  LACTICIDVEN Microbiology Recent Results (from the past 240 hour(s))  Resp  Panel by RT-PCR (Flu A&B, Covid) Nasopharyngeal Swab     Status: None   Collection Time: 06/25/21  5:29 AM   Specimen: Nasopharyngeal Swab; Nasopharyngeal(NP) swabs in vial transport medium  Result Value Ref Range Status   SARS Coronavirus 2 by RT PCR NEGATIVE NEGATIVE Final    Comment: (NOTE) SARS-CoV-2 target nucleic acids are NOT DETECTED.  The SARS-CoV-2 RNA is generally detectable in upper respiratory specimens during the acute phase of infection. The lowest concentration of SARS-CoV-2 viral copies this assay can detect is 138 copies/mL. A negative result does not preclude SARS-Cov-2 infection and should not be used as the sole basis for treatment or other patient management decisions. A negative result may occur with  improper specimen collection/handling, submission of specimen other than nasopharyngeal swab, presence of viral mutation(s) within the areas targeted by this assay, and inadequate number of viral copies(<138 copies/mL). A negative result must be combined with clinical observations, patient history, and epidemiological information. The expected result is Negative.  Fact Sheet for Patients:  EntrepreneurPulse.com.au  Fact Sheet for Healthcare Providers:  IncredibleEmployment.be  This test is no t yet approved or cleared by the [place] FDA and  has been authorized for detection and/or diagnosis of SARS-CoV-2 by FDA under an Emergency Use Authorization (EUA). This EUA will remain  in effect (meaning this test can be used) for the duration of the COVID-19 declaration under Section 564(b)(1) of the Act, 21 U.S.C.section 360bbb-3(b)(1), unless the authorization is terminated  or revoked sooner.       Influenza A by PCR NEGATIVE NEGATIVE Final   Influenza B by PCR NEGATIVE NEGATIVE Final    Comment: (NOTE) The Xpert Xpress SARS-CoV-2/FLU/RSV plus assay is intended as an aid in the diagnosis of influenza from Nasopharyngeal  swab specimens and should not be used as a sole basis for treatment. Nasal washings and aspirates are unacceptable for Xpert Xpress SARS-CoV-2/FLU/RSV testing.  Fact Sheet for Patients: EntrepreneurPulse.com.au  Fact Sheet for Healthcare Providers: IncredibleEmployment.be  This test is not yet approved or cleared by the [place] FDA and has been authorized for detection and/or diagnosis of SARS-CoV-2 by FDA under an Emergency Use Authorization (EUA). This EUA will remain in effect (meaning this test can be used) for the duration of the COVID-19 declaration under Section 564(b)(1) of the Act, 21 U.S.C. section 360bbb-3(b)(1), unless the authorization is terminated or revoked.  Performed at [facility], [place] [address] [place], [place] [address]    Time coordinating discharge: Less than 30 minutes  SIGNED:  Reubin Milan, MD  [facility] 06/25/2021, 1:27 PM Pager   If 7PM-7AM, please contact night-coverage [email or web address] Password [email or web address]

## 2021-06-25 NOTE — ED Notes (Signed)
Per ZUDODQ@ main lab- gold or red top tube (TSH) needed. RN advised. [employer]

## 2021-06-25 NOTE — Discharge Instructions (Signed)

## 2021-06-25 NOTE — ED Triage Notes (Signed)
Pt had a new heart monitor placed for his rapid heart beat 48 hrs ago. Pt reports palpitations and nausea.

## 2021-06-25 NOTE — Progress Notes (Signed)
ANTICOAGULATION CONSULT NOTE   Pharmacy Consult for Heparin Indication: atrial fibrillation  Allergies  Allergen Reactions   Ace Inhibitors Cough    Patient Measurements:   Heparin Dosing Weight: 92.4kg  Vital Signs: Temp: 98.1 F (36.7 C) (11/07 0338) Temp Source: Oral (11/07 0338) BP: 137/98 (11/07 0658) Pulse Rate: 74 (11/07 0658)  Labs: Recent Labs    06/25/21 0409 06/25/21 0657  HGB 15.4  --   HCT 44.6  --   PLT 273  --   CREATININE 0.80  --   TROPONINIHS 14 10   Estimated Creatinine Clearance: 115.1 mL/min (by C-G formula based on SCr of 0.8 mg/dL).  Medical History: Past Medical History:  Diagnosis Date   Asthma    Cancer ([place])    prostate    GERD (gastroesophageal reflux disease)    Hypertension    Sleep apnea    Medications:  Scheduled:   heparin  4,000 Units Intravenous Once   metoprolol tartrate  5 mg Intravenous Once   Infusions:   diltiazem (CARDIZEM) infusion 5 mg/hr (06/25/21 0723)   heparin      Assessment: 27 yoM - Afib with RVR, 10/26 Cards office note for palpitations:, TTE 10/28: EF 67%, currently on Holter monitor > to ED with possible Afib per monitor. Hx several yr intermittent palpitations. Baseline Hgb 15gm/dl, Plt 273. No anti-coagulation PTA  Goal of Therapy:  Heparin level 0.3-0.7 units/ml Monitor platelets by anticoagulation protocol: Yes   Plan:  Heparin bolus 4000 units, infusion at 1200 units 1st Heparin level in 6 hr Daily CBC ordered, plan daily Heparin level at steady state Anticipate oral anticoagulation  Minda Ditto PharmD 06/25/2021,7:59 AM

## 2021-06-25 NOTE — Consult Note (Signed)
Cardiology Consultation:   Patient ID: Clarence Perez MRN: [id number]; DOB: 10/23/1960  Admit date: 06/25/2021 Date of Consult: 06/25/2021  PCP:  Flossie Buffy, NP   [facility] Providers Cardiologist: New to [place]; Dr. Debara Pickett  Patient Profile:   Clarence Perez is a 60 y.o. male with a PMH of HTN, GERD, prostate cancer, and OSA who is being seen 06/25/2021 for the evaluation of new onset atrial fibrillation at the request of Dr. Olevia Bowens.  History of Present Illness:   Mr. Hlavaty was recently seen by his PCP 06/14/2019.  He reports intermittent palpitations which would occur monthly lasting for several hours at a time.  He had associated chest fullness but no chest pain and feelings of irregular heartbeats.  He was recommended to undergo an echocardiogram which occurred 06/15/2021 and showed EF 67%, no R WMA, mild asymmetric LVH of the posterior-lateral segment, G1 DD, and no significant valvular dysfunction.  He was also recommended to undergo an outpatient cardiac monitor which she is currently wearing.  There is a telephone encounter today at 1:50 AM suggesting cardiac monitor observed atrial fibrillation with rate 120s-130s bpm.  He presented to the ED for further evaluation.  He does not follow with cardiology outpatient.  Aside from the echocardiogram listed above he has not had any other cardiac work-up.  No prior ischemic evaluation.  At the time of this evaluation he is resting comfortably in bed.  He reports increasing frequency of palpitations over the past several years.  Previously would notice brief episodes of palpitations perhaps once a year however over the past year he has been having nearly monthly episodes some of which last 12-24 hours.  He typically notes a  Hospital course: HR initially elevated to 110s with intermittent mild hypertension, otherwise VSS.  EKG showed likely atrial flutter with RVR, rate 129 bpm, lateral T wave inversions, no ST  elevation/depression.  No previous EKG to compare.  Labs notable for electrolytes WNL, creatinine 0.8, CBC WNL, HsTrop 14> 10, influenza and COVID-19 negative.  CXR showed no acute findings. He was given IV fluids with no improvement in heart rate, subsequently started on diltiazem gtt. for rate control.  He was started on heparin drip for stroke PPx.  He was admitted to medicine and home medication continued.  Cardiology asked to evaluate for new onset atrial fibrillation. Past Medical History:  Diagnosis Date   Asthma    Cancer ([place])    prostate    GERD (gastroesophageal reflux disease)    Hypertension    Sleep apnea     Past Surgical History:  Procedure Laterality Date   prostate cryoblation       Home Medications:  Prior to Admission medications   Medication Sig Start Date End Date Taking? Authorizing Provider  albuterol (PROVENTIL HFA;VENTOLIN HFA) 108 (90 Base) MCG/ACT inhaler Inhale 1-2 puffs into the lungs every 6 (six) hours as needed. 02/13/17  Yes [provider]  azelastine (ASTELIN) 0.1 % nasal spray USE 2 SPRAYS IN EACH NOSTRIL TWICE DAILY AS DIRECTED Patient taking differently: Place 2 sprays into both nostrils 2 (two) times daily. 02/16/21  Yes Cirigliano, Mary K, DO  carvedilol (COREG) 12.5 MG tablet TAKE 1 TABLET(12.5 MG) BY MOUTH TWICE DAILY WITH A MEAL Patient taking differently: Take 12.5 mg by mouth 2 (two) times daily with a meal. TAKE 1 TABLET(12.5 MG) BY MOUTH TWICE DAILY WITH A MEAL 02/16/21  Yes Cirigliano, Mary K, DO  cetirizine (ZYRTEC) 10 MG tablet Take 10 mg  by mouth daily. 10/20/09  Yes [provider]  diclofenac Sodium (VOLTAREN) 1 % GEL Apply 2 g topically as needed (sholduer pain). 03/11/15  Yes [provider]  fluticasone (FLONASE) 50 MCG/ACT nasal spray Place 2 sprays into both nostrils daily. 03/23/14  Yes [provider]  hydrochlorothiazide (HYDRODIURIL) 25 MG tablet Take 1 tablet (25 mg total) by mouth daily. 02/16/21  Yes  Cirigliano, Mary K, DO  losartan (COZAAR) 100 MG tablet Take 1 tablet (100 mg total) by mouth daily. 06/24/21  Yes Nche, Charlene Brooke, NP  omeprazole (PRILOSEC) 40 MG capsule TAKE 1 CAPSULE(40 MG) BY MOUTH DAILY Patient taking differently: Take 40 mg by mouth daily as needed (reflux / gerd / heartburn). TAKE 1 CAPSULE(40 MG) BY MOUTH DAILY 02/16/21  Yes Ronnald Nian, DO    Inpatient Medications: Scheduled Meds:  azelastine  2 spray Each Nare BID   carvedilol  12.5 mg Oral BID WC   fluticasone  2 spray Each Nare Daily   hydrochlorothiazide  25 mg Oral Daily   loratadine  10 mg Oral Daily   losartan  100 mg Oral Daily   pantoprazole  40 mg Oral Daily   Continuous Infusions:  diltiazem (CARDIZEM) infusion 7.5 mg/hr (06/25/21 0845)   heparin 1,200 Units/hr (06/25/21 0844)   PRN Meds:   Allergies:    Allergies  Allergen Reactions   Ace Inhibitors Cough    Social History:   Social History   Socioeconomic History   Marital status: Married    Spouse name: Not on file   Number of children: Not on file   Years of education: Not on file   Highest education level: Not on file  Occupational History   Not on file  Tobacco Use   Smoking status: Never   Smokeless tobacco: Never  Vaping Use   Vaping Use: Never used  Substance and Sexual Activity   Alcohol use: Yes    Comment: socially   Drug use: Never   Sexual activity: Not on file  Other Topics Concern   Not on file  Social History Narrative   Not on file   Social Determinants of Health   Financial Resource Strain: Not on file  Food Insecurity: Not on file  Transportation Needs: Not on file  Physical Activity: Not on file  Stress: Not on file  Social Connections: Not on file  Intimate Partner Violence: Not on file    Family History:    Family History  Problem Relation Age of Onset   Cancer Mother        colon   Hypertension Mother    Cancer Father        prostate and kidney   CAD Maternal Uncle     Kidney cancer Maternal Grandmother      ROS:  Please see the history of present illness.   All other ROS reviewed and negative.     Physical Exam/Data:   Vitals:   06/25/21 0615 06/25/21 0658 06/25/21 0900 06/25/21 0915  BP: (!) 119/99 (!) 137/98 124/73   Pulse: 78 74 93 84  Resp: 14 11 16 18   Temp:      TempSrc:      SpO2: 99% 99% 98% 99%   No intake or output data in the 24 hours ending 06/25/21 1002 Last 3 Weights 06/13/2021 02/14/2021 02/09/2020  Weight (lbs) 203 lb 9.6 oz 202 lb 6.4 oz 204 lb 3.2 oz  Weight (kg) 92.352 kg 91.808 kg 92.625 kg  There is no height or weight on file to calculate BMI.  General:  Well nourished, well developed, in no acute distress HEENT: Sclera anicteric Neck: no JVD Vascular: No carotid bruits; Distal pulses 2+ bilaterally Cardiac:  normal S1, S2; IRIR; no murmur  Lungs:  clear to auscultation bilaterally, no wheezing, rhonchi or rales  Abd: soft, nontender, no hepatomegaly  Ext: Trace-1+ LLE edema with varicose veins Musculoskeletal:  No deformities, BUE and BLE strength normal and equal Skin: warm and dry  Neuro:  CNs 2-12 intact, no focal abnormalities noted Psych:  Normal affect   EKG:  The EKG was personally reviewed and demonstrates:  likely atrial flutter with RVR, rate 129 bpm, lateral T wave inversions, no ST elevation/depression.  No previous EKG to compare. Telemetry:  Telemetry was personally reviewed and demonstrates: Atrial flutter with variable AV block, heart rate improved from max 140s down to 80s.  Heart rate increased to 100s upon standing in the room.  Relevant CV Studies: Echocardiogram 06/15/21: 1. Left ventricular ejection fraction by 3D volume is 67 %. The left  ventricle has normal function. The left ventricle has no regional wall  motion abnormalities. There is mild asymmetric left ventricular  hypertrophy of the posterior-lateral segment.  Left ventricular diastolic parameters are consistent with Grade I   diastolic dysfunction (impaired relaxation). The average left ventricular  global longitudinal strain is -23.3 %. The global longitudinal strain is  normal.   2. Right ventricular systolic function is normal. The right ventricular  size is normal. Tricuspid regurgitation signal is inadequate for assessing  PA pressure.   3. The mitral valve is normal in structure. Trivial mitral valve  regurgitation. No evidence of mitral stenosis.   4. The aortic valve is tricuspid. Aortic valve regurgitation is not  visualized. No aortic stenosis is present.   5. The inferior vena cava is normal in size with greater than 50%  respiratory variability, suggesting right atrial pressure of 3 mmHg.   Laboratory Data:  High Sensitivity Troponin:   Recent Labs  Lab 06/25/21 0409 06/25/21 0657  TROPONINIHS 14 10     Chemistry Recent Labs  Lab 06/25/21 0409  NA 139  K 3.9  CL 104  CO2 26  GLUCOSE 123*  BUN 18  CREATININE 0.80  CALCIUM 9.9  MG 2.5*  GFRNONAA >60  ANIONGAP 9    Recent Labs  Lab 06/25/21 0409  PROT 7.8  ALBUMIN 4.3  AST 24  ALT 25  ALKPHOS 96  BILITOT 0.6   Lipids No results for input(s): CHOL, TRIG, HDL, LABVLDL, LDLCALC, CHOLHDL in the last 168 hours.  Hematology Recent Labs  Lab 06/25/21 0409  WBC 7.3  RBC 4.98  HGB 15.4  HCT 44.6  MCV 89.6  MCH 30.9  MCHC 34.5  RDW 12.9  PLT 273   Thyroid No results for input(s): TSH, FREET4 in the last 168 hours.  BNPNo results for input(s): BNP, PROBNP in the last 168 hours.  DDimer No results for input(s): DDIMER in the last 168 hours.   Radiology/Studies:  DG Chest 2 View  Result Date: 06/25/2021 CLINICAL DATA:  Palpitations, nausea, hypertension. EXAM: CHEST - 2 VIEW COMPARISON:  None. FINDINGS: The heart size and mediastinal contours are within normal limits. Both lungs are clear. The visualized skeletal structures are unremarkable. Electronic device is seen in the midline overlying the anterior upper chest and  there are multiple overlying monitor wires. IMPRESSION: No evidence of acute chest disease. Electronically Signed   By:  Telford Nab M.D.   On: 06/25/2021 06:05     Assessment and Plan:   1.  New onset atrial flutter with intermittent RVR: Patient with history of intermittent palpitation generally occurring once a month.  Saw PCP recently and was recommended to undergo an outpatient cardiac monitor.  Monitor was placed couple days ago and a telephone encounter this morning indicated the patient was in atrial fibrillation rates in the 120s-130s prompting ED visit.  Heart rate initially elevated to 110s, improved to 80s-90s on diltiazem drip.  He is also started on a heparin drip for stroke PPx.  Recent echocardiogram reassuring with EF 67%, no RWMA, G1DD, normal LA size, and no significant valvular abnormalities. - CHA2DS2-VASc Score = 1 [CHF History: 0, HTN History: 1, Diabetes History: 0, Stroke History: 0, Vascular Disease History: 0, Age Score: 0, Gender Score: 0].  Therefore, the patient's annual risk of stroke is 0.6 %.    -We will check hemoglobin A1c and lipid panel for risk stratification. Will check TSH as well for possible contributor -Would benefit from an ischemic evaluation given new findings of A. Fib - recommend coronary CTA to define coronary anatomy.  This can be done in the outpatient setting. -Can convert IV diltiazem to p.o. given stable rate control. -Continue home carvedilol for added rate control. -Suspect ultimately he would benefit from an ablation down the road.  Would like to get him connected with the A. fib clinic/EP for consideration following ischemic evaluation as above. -In anticipation of possible ablation, we will go ahead and start Eliquis 5 mg twice daily  2.  HTN: BP intermittently mildly elevated but overall stable. -Continue home carvedilol, losartan, and HCTZ -We will add p.o. diltiazem as above  3.  Pre-DM type II: A1c 5.9 this admission -Encouraged  healthy dietary/lifestyle modifications to prevent progression  4.  OSA: Reports being diagnosed 10 years ago but is not on CPAP.  Possible this is contributing to #1 -May benefit from repeat sleep study and re-trial of CPAP.  This can be coordinated outpatient    Risk Assessment/Risk Scores:   CHA2DS2-VASc Score = 1  This indicates a 0.6% annual risk of stroke. The patient's score is based upon: CHF History: 0 HTN History: 1 Diabetes History: 0 Stroke History: 0 Vascular Disease History: 0 Age Score: 0 Gender Score: 0         For questions or updates, please contact [place] Please consult [email or web address] for contact info under    Signed, Abigail Butts, PA-C  06/25/2021 10:02 AM

## 2021-07-02 ENCOUNTER — Encounter (HOSPITAL_COMMUNITY): Payer: Self-pay | Admitting: Physician Assistant

## 2021-07-02 ENCOUNTER — Ambulatory Visit (HOSPITAL_COMMUNITY)
Admission: RE | Admit: 2021-07-02 | Discharge: 2021-07-02 | Disposition: A | Discharge status: Home / Self Care | Payer: 59 | Source: Ambulatory Visit | Attending: Physician Assistant | Admitting: Physician Assistant

## 2021-07-02 VITALS — BP 150/90 | HR 58 | Ht 71.5 in | Wt 198.8 lb

## 2021-07-02 DIAGNOSIS — I480 Paroxysmal atrial fibrillation: Secondary | ICD-10-CM | POA: Diagnosis present

## 2021-07-02 DIAGNOSIS — Z7901 Long term (current) use of anticoagulants: Secondary | ICD-10-CM | POA: Insufficient documentation

## 2021-07-02 DIAGNOSIS — G4733 Obstructive sleep apnea (adult) (pediatric): Secondary | ICD-10-CM | POA: Diagnosis not present

## 2021-07-02 DIAGNOSIS — Z8546 Personal history of malignant neoplasm of prostate: Secondary | ICD-10-CM | POA: Diagnosis not present

## 2021-07-02 DIAGNOSIS — I10 Essential (primary) hypertension: Secondary | ICD-10-CM | POA: Diagnosis not present

## 2021-07-02 MED ORDER — APIXABAN 5 MG PO TABS: 5.0000 mg | ORAL_TABLET | Freq: Two times a day (BID) | ORAL | 3 refills | Status: DC

## 2021-07-02 NOTE — Progress Notes (Signed)
Primary Care Physician: Flossie Buffy, NP Primary Cardiologist: Dr Debara Pickett Primary Electrophysiologist: none Referring Physician: Roby Lofts PA   Clarence Perez is a 60 y.o. male with a history of HTN, GERD, prostate cancer, OSA, atrial fibrillation who presents for consultation in the [facility]. Patient was seen by his PCP on 06/13/21 with symptoms of intermittent palpitations. An echo was ordered which showed EF 67%. A cardiac monitor was also ordered which showed afib with rapid rates. He presented to the ED for evaluation.  Patient is on Eliquis for a CHADS2VASC score of 1. He was started on diltiazem in addition to his carvedilol for rate control. He is in SR today with no recent palpitations. He did have one episode of nausea, clamminess, and dizziness while having a bowel movement. No recurrence.   Today, he denies symptoms of palpitations, chest pain, shortness of breath, orthopnea, PND, lower extremity edema, presyncope, syncope, snoring, daytime somnolence, bleeding, or neurologic sequela. The patient is tolerating medications without difficulties and is otherwise without complaint today.    Atrial Fibrillation Risk Factors:  he does have symptoms or diagnosis of sleep apnea. he is not on CPAP therapy. he does not have a history of rheumatic fever. he does not have a history of alcohol use. The patient does not have a history of early familial atrial fibrillation or other arrhythmias.  he has a BMI of Body mass index is 27.34 kg/m.Marland Kitchen Filed Weights   07/02/21 1016  Weight: 90.2 kg    Family History  Problem Relation Age of Onset   Cancer Mother        colon   Hypertension Mother    Cancer Father        prostate and kidney   CAD Maternal Uncle    Kidney cancer Maternal Grandmother      Atrial Fibrillation Management history:  Previous antiarrhythmic drugs: none Previous cardioversions: none Previous ablations:  none CHADS2VASC score: 1 Anticoagulation history: Eliquis   Past Medical History:  Diagnosis Date   Asthma    Cancer ([place])    prostate    GERD (gastroesophageal reflux disease)    Hypertension    Sleep apnea    Past Surgical History:  Procedure Laterality Date   prostate cryoblation      Current Outpatient Medications  Medication Sig Dispense Refill   azelastine (ASTELIN) 0.1 % nasal spray USE 2 SPRAYS IN EACH NOSTRIL TWICE DAILY AS DIRECTED 90 mL 3   carvedilol (COREG) 12.5 MG tablet TAKE 1 TABLET(12.5 MG) BY MOUTH TWICE DAILY WITH A MEAL 180 tablet 3   cetirizine (ZYRTEC) 10 MG tablet Take 10 mg by mouth daily.     diclofenac Sodium (VOLTAREN) 1 % GEL Apply 2 g topically as needed (sholduer pain).     diltiazem (CARDIZEM CD) 180 MG 24 hr capsule Take 1 capsule (180 mg total) by mouth daily. 30 capsule 0   fluticasone (FLONASE) 50 MCG/ACT nasal spray Place 2 sprays into both nostrils daily.     hydrochlorothiazide (HYDRODIURIL) 12.5 MG tablet Take 1 tablet (12.5 mg total) by mouth daily. (Patient taking differently: Take 25 mg by mouth daily.) 30 tablet 0   levalbuterol (XOPENEX HFA) 45 MCG/ACT inhaler Inhale 2 puffs into the lungs every 6 (six) hours as needed for wheezing. 1 each 0   losartan (COZAAR) 100 MG tablet Take 1 tablet (100 mg total) by mouth daily. 90 tablet 3   omeprazole (PRILOSEC) 40 MG capsule TAKE 1 CAPSULE(40  MG) BY MOUTH DAILY 90 capsule 3   apixaban (ELIQUIS) 5 MG TABS tablet Take 1 tablet (5 mg total) by mouth 2 (two) times daily. 60 tablet 3   No current facility-administered medications for this encounter.    Allergies  Allergen Reactions   Ace Inhibitors Cough    Social History   Socioeconomic History   Marital status: Married    Spouse name: Not on file   Number of children: Not on file   Years of education: Not on file   Highest education level: Not on file  Occupational History   Not on file  Tobacco Use   Smoking status: Never    Smokeless tobacco: Never  Vaping Use   Vaping Use: Never used  Substance and Sexual Activity   Alcohol use: Yes    Alcohol/week: 7.0 standard drinks    Types: 7 Cans of beer per week    Comment: 1 beer daily 07/02/21   Drug use: Never   Sexual activity: Not on file  Other Topics Concern   Not on file  Social History Narrative   Not on file   Social Determinants of Health   Financial Resource Strain: Not on file  Food Insecurity: Not on file  Transportation Needs: Not on file  Physical Activity: Not on file  Stress: Not on file  Social Connections: Not on file  Intimate Partner Violence: Not on file     ROS- All systems are reviewed and negative except as per the HPI above.  Physical Exam: Vitals:   07/02/21 1016  BP: (!) 150/90  Pulse: (!) 58  Weight: 90.2 kg  Height: 5' 11.5" (1.816 m)    GEN- The patient is a well appearing male, alert and oriented x 3 today.   Head- normocephalic, atraumatic Eyes-  Sclera clear, conjunctiva pink Ears- hearing intact Oropharynx- clear Neck- supple  Lungs- Clear to ausculation bilaterally, normal work of breathing Heart- Regular rate and rhythm, no murmurs, rubs or gallops  GI- soft, NT, ND, + BS Extremities- no clubbing, cyanosis, or edema MS- no significant deformity or atrophy Skin- no rash or lesion Psych- euthymic mood, full affect Neuro- strength and sensation are intact  Wt Readings from Last 3 Encounters:  07/02/21 90.2 kg  06/13/21 92.4 kg  02/14/21 91.8 kg    EKG today demonstrates  SB, lateral T changes Vent. rate 58 BPM PR interval 160 ms QRS duration 100 ms QT/QTcB 388/380 ms  Echo 06/15/21 demonstrated  1. Left ventricular ejection fraction by 3D volume is 67 %. The left  ventricle has normal function. The left ventricle has no regional wall  motion abnormalities. There is mild asymmetric left ventricular  hypertrophy of the posterior-lateral segment. Left ventricular diastolic parameters are  consistent with Grade I diastolic dysfunction (impaired relaxation). The average left ventricular global longitudinal strain is -23.3 %. The global longitudinal strain is normal.   2. Right ventricular systolic function is normal. The right ventricular  size is normal. Tricuspid regurgitation signal is inadequate for assessing PA pressure.   3. The mitral valve is normal in structure. Trivial mitral valve  regurgitation. No evidence of mitral stenosis.   4. The aortic valve is tricuspid. Aortic valve regurgitation is not  visualized. No aortic stenosis is present.   5. The inferior vena cava is normal in size with greater than 50%  respiratory variability, suggesting right atrial pressure of 3 mmHg.   Epic records are reviewed at length today  CHA2DS2-VASc Score = 1  The patient's score is based upon: CHF History: 0 HTN History: 1 Diabetes History: 0 Stroke History: 0 Vascular Disease History: 0 Age Score: 0 Gender Score: 0       ASSESSMENT AND PLAN: 1. Paroxysmal Atrial Fibrillation (ICD10:  I48.0) The patient's CHA2DS2-VASc score is 1, indicating a 0.6% annual risk of stroke.   General education about afib provided and questions answered. We also discussed his stroke risk and the risks and benefits of anticoagulation. ? Secondary to untreated OSA. We discussed rhythm control options including AAD and ablation. Patient would like to continue present therapy for now and see how he does with treating his OSA. Would likely be a good candidate for front-line ablation.  Continue Eliquis 5 mg BID for now. Continue diltiazem 180 mg daily Continue carvedilol 12.5 mg BID Check coronary CT to evaluate for ischemia with new onset afib and T wave changes.   2. HTN Mildly elevated today, patient admits he is very anxious today.  No changes today.  3. Obstructive sleep apnea The importance of adequate treatment of sleep apnea was discussed today in order to improve our ability to maintain  sinus rhythm long term. Will refer for sleep evaluation.    Follow up in the AF clinic in one month.    [facility] [address] [place], [place] [address] [phone] 07/02/2021 12:01 PM

## 2021-07-06 ENCOUNTER — Telehealth: Payer: Self-pay | Admitting: *Deleted

## 2021-07-06 NOTE — Telephone Encounter (Signed)
Prior Authorization for split night sleep study sent to [facility] via web portal. Tracking Number [id number].

## 2021-07-12 ENCOUNTER — Encounter: Payer: Self-pay | Admitting: Nurse Practitioner

## 2021-07-12 DIAGNOSIS — I10 Essential (primary) hypertension: Secondary | ICD-10-CM

## 2021-07-13 MED ORDER — CARVEDILOL 12.5 MG PO TABS: ORAL_TABLET | ORAL | 2 refills | Status: DC

## 2021-07-15 NOTE — Telephone Encounter (Signed)
Patient seen in AF clinic.

## 2021-07-16 ENCOUNTER — Other Ambulatory Visit (HOSPITAL_COMMUNITY): Payer: Self-pay | Admitting: *Deleted

## 2021-07-16 MED ORDER — APIXABAN 5 MG PO TABS: 5.0000 mg | ORAL_TABLET | Freq: Two times a day (BID) | ORAL | 2 refills | Status: DC

## 2021-07-16 NOTE — Telephone Encounter (Signed)
Received a call from [facility] denying in lab sleep study. Patient can have HST which does not require a PA or provider can call [phone] and do a peer to peer.

## 2021-07-17 ENCOUNTER — Encounter (HOSPITAL_COMMUNITY): Payer: Self-pay

## 2021-07-17 ENCOUNTER — Other Ambulatory Visit (HOSPITAL_COMMUNITY): Payer: Self-pay | Admitting: *Deleted

## 2021-07-17 ENCOUNTER — Telehealth: Payer: Self-pay | Admitting: Physician Assistant

## 2021-07-17 MED ORDER — DILTIAZEM HCL 30 MG PO TABS: ORAL_TABLET | ORAL | 1 refills | Status: DC

## 2021-07-17 MED ORDER — DILTIAZEM HCL ER COATED BEADS 180 MG PO CP24
180.0000 mg | ORAL_CAPSULE | Freq: Every day | ORAL | 3 refills | Status: DC
Start: 2021-07-17 — End: 2021-08-16

## 2021-07-17 NOTE — Telephone Encounter (Signed)
Paged by answering service. Patient went into afib few hours ago. Much less symptomatic then prior. I have advise to take regular dose of cardizem and coreg this morning. Continue Eliquis. HR was in 80-90s.   If still in afib after few hours, he may take another dose of cardizem. Reviewed parameters. Patient is a pain management physician.   Recommended to make appointment in afib clinic to discussed antiarrhythmic or ablation.

## 2021-07-18 ENCOUNTER — Other Ambulatory Visit: Payer: Self-pay | Admitting: Physician Assistant

## 2021-07-18 DIAGNOSIS — G4733 Obstructive sleep apnea (adult) (pediatric): Secondary | ICD-10-CM

## 2021-07-18 DIAGNOSIS — I4891 Unspecified atrial fibrillation: Secondary | ICD-10-CM

## 2021-07-18 DIAGNOSIS — I10 Essential (primary) hypertension: Secondary | ICD-10-CM

## 2021-07-18 NOTE — Telephone Encounter (Signed)
Received a denial from [facility] for in lab sleep study. HST scheduled per Clint [name].left message for patient to return a call to me for appointment details.

## 2021-07-24 DIAGNOSIS — J342 Deviated nasal septum: Secondary | ICD-10-CM | POA: Insufficient documentation

## 2021-07-24 DIAGNOSIS — J343 Hypertrophy of nasal turbinates: Secondary | ICD-10-CM | POA: Insufficient documentation

## 2021-07-24 DIAGNOSIS — J310 Chronic rhinitis: Secondary | ICD-10-CM | POA: Insufficient documentation

## 2021-08-01 ENCOUNTER — Encounter (HOSPITAL_COMMUNITY): Payer: Self-pay | Admitting: Physician Assistant

## 2021-08-01 ENCOUNTER — Other Ambulatory Visit: Payer: Self-pay

## 2021-08-01 ENCOUNTER — Ambulatory Visit (HOSPITAL_COMMUNITY)
Admission: RE | Admit: 2021-08-01 | Discharge: 2021-08-01 | Disposition: A | Discharge status: Home / Self Care | Payer: 59 | Source: Ambulatory Visit | Attending: Physician Assistant | Admitting: Physician Assistant

## 2021-08-01 VITALS — BP 142/90 | HR 62 | Ht 71.5 in | Wt 198.0 lb

## 2021-08-01 DIAGNOSIS — K219 Gastro-esophageal reflux disease without esophagitis: Secondary | ICD-10-CM | POA: Insufficient documentation

## 2021-08-01 DIAGNOSIS — I10 Essential (primary) hypertension: Secondary | ICD-10-CM | POA: Diagnosis not present

## 2021-08-01 DIAGNOSIS — Z7901 Long term (current) use of anticoagulants: Secondary | ICD-10-CM | POA: Diagnosis not present

## 2021-08-01 DIAGNOSIS — I480 Paroxysmal atrial fibrillation: Secondary | ICD-10-CM | POA: Diagnosis not present

## 2021-08-01 DIAGNOSIS — G4733 Obstructive sleep apnea (adult) (pediatric): Secondary | ICD-10-CM | POA: Insufficient documentation

## 2021-08-01 DIAGNOSIS — Z8546 Personal history of malignant neoplasm of prostate: Secondary | ICD-10-CM | POA: Insufficient documentation

## 2021-08-01 DIAGNOSIS — Z79899 Other long term (current) drug therapy: Secondary | ICD-10-CM | POA: Diagnosis not present

## 2021-08-01 LAB — BASIC METABOLIC PANEL
Anion gap: 7 (ref 5–15)
BUN: 12 mg/dL (ref 8–23)
CO2: 27 mmol/L (ref 22–32)
Calcium: 9.5 mg/dL (ref 8.9–10.3)
Chloride: 103 mmol/L (ref 98–111)
Creatinine, Ser: 0.94 mg/dL (ref 0.61–1.24)
GFR, Estimated: >60 mL/min (ref >60)
Glucose, Bld: 97 mg/dL (ref 70–99)
Potassium: 3.8 mmol/L (ref 3.5–5.1)
Sodium: 137 mmol/L (ref 135–145)

## 2021-08-01 LAB — CBC
HCT: 42.7 % (ref 39.0–52.0)
Hemoglobin: 14.5 g/dL (ref 13.0–17.0)
MCH: 30.5 pg (ref 26.0–34.0)
MCHC: 34 g/dL (ref 30.0–36.0)
MCV: 89.7 fL (ref 80.0–100.0)
Platelets: 243 10*3/uL (ref 150–400)
RBC: 4.76 MIL/uL (ref 4.22–5.81)
RDW: 12.5 % (ref 11.5–15.5)
WBC: 5.2 10*3/uL (ref 4.0–10.5)
nRBC: 0 % (ref 0.0–0.2)

## 2021-08-01 MED ORDER — HYDROCHLOROTHIAZIDE 25 MG PO TABS: 25.0000 mg | ORAL_TABLET | Freq: Every day | ORAL | 1 refills | Status: DC

## 2021-08-01 NOTE — Progress Notes (Signed)
Primary Care Physician: Flossie Buffy, NP Primary Cardiologist: Dr Debara Pickett Primary Electrophysiologist: none Referring Physician: Roby Lofts PA   Clarence Perez is a 60 y.o. male with a history of HTN, GERD, prostate cancer, OSA, atrial fibrillation who presents for follow up in the [facility]. Patient was seen by his PCP on 06/13/21 with symptoms of intermittent palpitations. An echo was ordered which showed EF 67%. A cardiac monitor was also ordered which showed afib with rapid rates. He presented to the ED for evaluation.  Patient is on Eliquis for a CHADS2VASC score of 1. He was started on diltiazem in addition to his carvedilol for rate control.   On follow up today, patient had an additional episode of afib on 07/17/21 which lasted for several hours. He did take a PRN diltiazem which helped to slow the heart rate. Heart monitor showed 4% afib burden overall. He is scheduled for a sleep study on 08/15/21.  Today, he denies symptoms of palpitations, chest pain, shortness of breath, orthopnea, PND, lower extremity edema, presyncope, syncope, bleeding, or neurologic sequela. The patient is tolerating medications without difficulties and is otherwise without complaint today.    Atrial Fibrillation Risk Factors:  he does have symptoms or diagnosis of sleep apnea. he is not on CPAP therapy. he does not have a history of rheumatic fever. he does not have a history of alcohol use. The patient does not have a history of early familial atrial fibrillation or other arrhythmias.  he has a BMI of Body mass index is 27.23 kg/m.Marland Kitchen Filed Weights   08/01/21 1404  Weight: 89.8 kg     Family History  Problem Relation Age of Onset   Cancer Mother        colon   Hypertension Mother    Cancer Father        prostate and kidney   CAD Maternal Uncle    Kidney cancer Maternal Grandmother      Atrial Fibrillation Management history:  Previous  antiarrhythmic drugs: none Previous cardioversions: none Previous ablations: none CHADS2VASC score: 1 Anticoagulation history: Eliquis   Past Medical History:  Diagnosis Date   Asthma    Cancer ([place])    prostate    GERD (gastroesophageal reflux disease)    Hypertension    Sleep apnea    Past Surgical History:  Procedure Laterality Date   prostate cryoblation      Current Outpatient Medications  Medication Sig Dispense Refill   apixaban (ELIQUIS) 5 MG TABS tablet Take 1 tablet (5 mg total) by mouth 2 (two) times daily. 180 tablet 2   azelastine (ASTELIN) 0.1 % nasal spray USE 2 SPRAYS IN EACH NOSTRIL TWICE DAILY AS DIRECTED 90 mL 3   carvedilol (COREG) 12.5 MG tablet TAKE 1 TABLET(12.5 MG) BY MOUTH TWICE DAILY WITH A MEAL 180 tablet 2   cetirizine (ZYRTEC) 10 MG tablet Take 10 mg by mouth daily.     diclofenac Sodium (VOLTAREN) 1 % GEL Apply 2 g topically as needed (sholduer pain).     diltiazem (CARDIZEM CD) 180 MG 24 hr capsule Take 1 capsule (180 mg total) by mouth daily. 30 capsule 3   diltiazem (CARDIZEM) 30 MG tablet Take 1 tablet every 4 hours AS NEEDED for heart rate >100 45 tablet 1   fluticasone (FLONASE) 50 MCG/ACT nasal spray Place 2 sprays into both nostrils daily.     levalbuterol (XOPENEX HFA) 45 MCG/ACT inhaler Inhale 2 puffs into the lungs every 6 (  six) hours as needed for wheezing. 1 each 0   losartan (COZAAR) 100 MG tablet Take 1 tablet (100 mg total) by mouth daily. 90 tablet 3   omeprazole (PRILOSEC) 40 MG capsule TAKE 1 CAPSULE(40 MG) BY MOUTH DAILY 90 capsule 3   hydrochlorothiazide (HYDRODIURIL) 25 MG tablet Take 1 tablet (25 mg total) by mouth daily. 90 tablet 1   No current facility-administered medications for this encounter.    Allergies  Allergen Reactions   Ace Inhibitors Cough    Social History   Socioeconomic History   Marital status: Married    Spouse name: Not on file   Number of children: Not on file   Years of education: Not on file    Highest education level: Not on file  Occupational History   Not on file  Tobacco Use   Smoking status: Never   Smokeless tobacco: Never  Vaping Use   Vaping Use: Never used  Substance and Sexual Activity   Alcohol use: Yes    Alcohol/week: 7.0 standard drinks    Types: 7 Cans of beer per week    Comment: 1 beer daily 07/02/21   Drug use: Never   Sexual activity: Not on file  Other Topics Concern   Not on file  Social History Narrative   Not on file   Social Determinants of Health   Financial Resource Strain: Not on file  Food Insecurity: Not on file  Transportation Needs: Not on file  Physical Activity: Not on file  Stress: Not on file  Social Connections: Not on file  Intimate Partner Violence: Not on file     ROS- All systems are reviewed and negative except as per the HPI above.  Physical Exam: Vitals:   08/01/21 1404  BP: (!) 142/90  Pulse: 62  Weight: 89.8 kg  Height: 5' 11.5" (1.816 m)    GEN- The patient is a well appearing male, alert and oriented x 3 today.   HEENT-head normocephalic, atraumatic, sclera clear, conjunctiva pink, hearing intact, trachea midline. Lungs- Clear to ausculation bilaterally, normal work of breathing Heart- Regular rate and rhythm, no murmurs, rubs or gallops  GI- soft, NT, ND, + BS Extremities- no clubbing, cyanosis, or edema MS- no significant deformity or atrophy Skin- no rash or lesion Psych- euthymic mood, full affect Neuro- strength and sensation are intact   Wt Readings from Last 3 Encounters:  08/01/21 89.8 kg  07/02/21 90.2 kg  06/13/21 92.4 kg    EKG today demonstrates  SR, NST Vent. rate 62 BPM PR interval 164 ms QRS duration 100 ms QT/QTcB 376/381 ms  Echo 06/15/21 demonstrated  1. Left ventricular ejection fraction by 3D volume is 67 %. The left  ventricle has normal function. The left ventricle has no regional wall  motion abnormalities. There is mild asymmetric left ventricular  hypertrophy of  the posterior-lateral segment. Left ventricular diastolic parameters are consistent with Grade I diastolic dysfunction (impaired relaxation). The average left ventricular global longitudinal strain is -23.3 %. The global longitudinal strain is normal.   2. Right ventricular systolic function is normal. The right ventricular  size is normal. Tricuspid regurgitation signal is inadequate for assessing PA pressure.   3. The mitral valve is normal in structure. Trivial mitral valve  regurgitation. No evidence of mitral stenosis.   4. The aortic valve is tricuspid. Aortic valve regurgitation is not  visualized. No aortic stenosis is present.   5. The inferior vena cava is normal in size with greater  than 50%  respiratory variability, suggesting right atrial pressure of 3 mmHg.   Epic records are reviewed at length today  CHA2DS2-VASc Score = 1  The patient's score is based upon: CHF History: 0 HTN History: 1 Diabetes History: 0 Stroke History: 0 Vascular Disease History: 0 Age Score: 0 Gender Score: 0       ASSESSMENT AND PLAN: 1. Paroxysmal Atrial Fibrillation (ICD10:  I48.0) The patient's CHA2DS2-VASc score is 1, indicating a 0.6% annual risk of stroke.   ? Secondary to untreated OSA. 4% burden on heart monitor. Patient interested in rhythm control with ablation, will refer to EP. Continue Eliquis 5 mg BID Continue diltiazem 180 mg daily Continue carvedilol 12.5 mg BID  2. HTN Stable, no changes today.  3. Obstructive sleep apnea Referred for sleep study, scheduled for 08/15/21. Also seen by Dr Wilburn Cornelia ENT at [facility].   Follow up with EP to discuss possible ablation.    [facility] [address] [place], [place] [address] [phone] 08/01/2021 4:38 PM

## 2021-08-08 ENCOUNTER — Encounter (HOSPITAL_BASED_OUTPATIENT_CLINIC_OR_DEPARTMENT_OTHER): Payer: 59 | Admitting: Cardiovascular Disease

## 2021-08-15 ENCOUNTER — Ambulatory Visit (HOSPITAL_BASED_OUTPATIENT_CLINIC_OR_DEPARTMENT_OTHER): Payer: 59 | Attending: Physician Assistant | Admitting: Cardiovascular Disease

## 2021-08-15 DIAGNOSIS — I10 Essential (primary) hypertension: Secondary | ICD-10-CM | POA: Insufficient documentation

## 2021-08-15 DIAGNOSIS — I482 Chronic atrial fibrillation, unspecified: Secondary | ICD-10-CM | POA: Insufficient documentation

## 2021-08-15 DIAGNOSIS — G4736 Sleep related hypoventilation in conditions classified elsewhere: Secondary | ICD-10-CM | POA: Diagnosis not present

## 2021-08-15 DIAGNOSIS — R0683 Snoring: Secondary | ICD-10-CM | POA: Diagnosis not present

## 2021-08-15 DIAGNOSIS — I4891 Unspecified atrial fibrillation: Secondary | ICD-10-CM

## 2021-08-15 DIAGNOSIS — G4733 Obstructive sleep apnea (adult) (pediatric): Secondary | ICD-10-CM | POA: Insufficient documentation

## 2021-08-16 ENCOUNTER — Other Ambulatory Visit (HOSPITAL_COMMUNITY): Payer: Self-pay | Admitting: *Deleted

## 2021-08-16 MED ORDER — DILTIAZEM HCL ER COATED BEADS 180 MG PO CP24
180.0000 mg | ORAL_CAPSULE | Freq: Every day | ORAL | 1 refills | Status: DC
Start: 2021-08-16 — End: 2022-01-03

## 2021-08-30 ENCOUNTER — Encounter (HOSPITAL_COMMUNITY): Payer: Self-pay

## 2021-08-30 ENCOUNTER — Encounter: Payer: Self-pay | Admitting: Nurse Practitioner

## 2021-08-31 ENCOUNTER — Encounter (HOSPITAL_BASED_OUTPATIENT_CLINIC_OR_DEPARTMENT_OTHER): Payer: Self-pay | Admitting: Cardiovascular Disease

## 2021-08-31 NOTE — Procedures (Signed)
° ° °  Patient Name: Clarence Perez, [place] Date: 08/18/2021 Gender: Male D.O.B: [birth year] Age (years): 58 Referring Provider: Malka So PA Height (inches): 71 Interpreting Physician: Shelva Majestic MD, ABSM Weight (lbs): 198 RPSGT: Jacolyn Reedy BMI: 28 MRN: [id number] Neck Size: 16.50  CLINICAL INFORMATION Sleep Study Type: HST  Indication for sleep study: Atrial fibrillation, snoring  Epworth Sleepiness Score: 5  SLEEP STUDY TECHNIQUE A multi-channel overnight portable sleep study was performed. The channels recorded were: nasal airflow, thoracic respiratory movement, and oxygen saturation with a pulse oximetry. Snoring was also monitored.  MEDICATIONS Patient self administered medications include: N/A.  SLEEP ARCHITECTURE Patient was studied for 362 minutes. The sleep efficiency was 99.2 % and the patient was supine for 29.1%. The arousal index was 0.0 per hour.  RESPIRATORY PARAMETERS The overall AHI was 36.3 per hour, with a central apnea index of 0 per hour. There was a positional component with supine sleep AHI 53.0/h versus non-supine sleep AHI 29.4/h.  The oxygen nadir was 78% during sleep. Time spent < 89% was 29.3 minutes.  CARDIAC DATA Mean heart rate during sleep was 56.2 bpm.  IMPRESSIONS - Severe obstructive sleep apnea occurred during this study (AHI 36.3/h). - Severe oxygen desaturation to a nadir of 78%. - Patient snored 2.8% during the sleep.  DIAGNOSIS - Obstructive Sleep Apnea (G47.33) - Nocturnal Hypoxemia (G47.36)  RECOMMENDATIONS - Therapeutic CPAP for treatment of the patient's severe sleep disordered breathing. If unable to schedule an in-lab titration, initiate Auto-PAP with EPR of 3 at 7 - 18 cm of water. - Effort should be made to optimize nasal and oropharyngeal patency. - Positional therapy avoiding supine position during sleep. - Avoid alcohol, sedatives and other CNS depressants that may worsen sleep apnea and disrupt normal  sleep architecture. - Sleep hygiene should be reviewed to assess factors that may improve sleep quality. - Weight management and regular exercise should be initiated or continued. - Recommend a download and sleep clinic evaluation after one month of therapy.   [Electronically signed] 08/31/2021 09:56 AM  Shelva Majestic MD, [facility], [place], American Board of Sleep Medicine   NPI: [id number] [place] PH: [phone]   FX: [phone] [place]

## 2021-09-05 ENCOUNTER — Telehealth: Payer: Self-pay | Admitting: *Deleted

## 2021-09-05 ENCOUNTER — Other Ambulatory Visit: Payer: Self-pay | Admitting: Cardiovascular Disease

## 2021-09-05 DIAGNOSIS — G4736 Sleep related hypoventilation in conditions classified elsewhere: Secondary | ICD-10-CM

## 2021-09-05 DIAGNOSIS — G4733 Obstructive sleep apnea (adult) (pediatric): Secondary | ICD-10-CM

## 2021-09-05 NOTE — Telephone Encounter (Signed)
Left message to return a call to discuss HST results and recommendations. 

## 2021-09-05 NOTE — Telephone Encounter (Signed)
Prior Authorization for CPAP titration sent to [facility] via web portal. Tracking Number [id number].

## 2021-09-11 NOTE — Telephone Encounter (Signed)
Received a call from [facility] denying requested CPAP titration. APAP will be ordered per Dr Evette Georges instructions.

## 2021-09-11 NOTE — Telephone Encounter (Signed)
Patient informed of titration denial. APAP orders were sent to [facility].

## 2021-09-17 ENCOUNTER — Other Ambulatory Visit: Payer: Self-pay

## 2021-09-17 ENCOUNTER — Ambulatory Visit: Payer: 59 | Admitting: Cardiology

## 2021-09-17 ENCOUNTER — Encounter: Payer: Self-pay | Admitting: Cardiology

## 2021-09-17 VITALS — BP 150/90 | HR 66 | Ht 72.0 in | Wt 190.2 lb

## 2021-09-17 DIAGNOSIS — I480 Paroxysmal atrial fibrillation: Secondary | ICD-10-CM

## 2021-09-17 NOTE — Patient Instructions (Signed)
Medication Instructions:  Your physician recommends that you continue on your current medications as directed. Please refer to the Current Medication list given to you today.  *If you need a refill on your cardiac medications before your next appointment, please call your pharmacy*   Lab Work: None ordered   Testing/Procedures: None ordered   Follow-Up: At [facility], you and your health needs are our priority.  As part of our continuing mission to provide you with exceptional heart care, we have created designated Provider Care Teams.  These Care Teams include your primary Cardiologist (physician) and Advanced Practice Providers (APPs -  Physician Assistants and Nurse Practitioners) who all work together to provide you with the care you need, when you need it.  Your next appointment:   3 month(s)  The format for your next appointment:   In Person  Provider:   Will Camnitz, MD    Thank you for choosing [facility]!!   Sarea Fyfe, RN [phone]     

## 2021-09-17 NOTE — Progress Notes (Signed)
Electrophysiology Office Note   Date:  09/17/2021   ID:  Clarence Perez, DOB 07/30/1961, MRN [id number]  PCP:  Flossie Buffy, NP  Cardiologist:  Debara Pickett Primary Electrophysiologist:  Mckaylee Dimalanta Meredith Leeds, MD    Chief Complaint: AF   History of Present Illness: Clarence Perez is a 61 y.o. male who is being seen today for the evaluation of AF at the request of Perez, Clarence R, PA. Presenting today for electrophysiology evaluation.  He has a history significant for hypertension, GERD, prostate cancer, OSA, atrial fibrillation.  He presented to his primary physician's office and was noted to be in atrial fibrillation.  He has been having palpitations for the last few years.  Palpitations have increased over time.  Most the time, he wakes up in atrial fibrillation.  His episodes last for most of the day.  He is having episodes once every month or so.  Today, he denies symptoms of palpitations, chest pain, shortness of breath, orthopnea, PND, lower extremity edema, claudication, dizziness, presyncope, syncope, bleeding, or neurologic sequela. The patient is tolerating medications without difficulties.    Past Medical History:  Diagnosis Date   Asthma    Cancer ([place])    prostate    GERD (gastroesophageal reflux disease)    Hypertension    Sleep apnea    Past Surgical History:  Procedure Laterality Date   prostate cryoblation       Current Outpatient Medications  Medication Sig Dispense Refill   albuterol (VENTOLIN HFA) 108 (90 Base) MCG/ACT inhaler Inhale 2 puffs into the lungs every 4 (four) hours as needed for shortness of breath.     apixaban (ELIQUIS) 5 MG TABS tablet Take 1 tablet (5 mg total) by mouth 2 (two) times daily. 180 tablet 2   azelastine (ASTELIN) 0.1 % nasal spray USE 2 SPRAYS IN EACH NOSTRIL TWICE DAILY AS DIRECTED 90 mL 3   carvedilol (COREG) 12.5 MG tablet TAKE 1 TABLET(12.5 MG) BY MOUTH TWICE DAILY WITH A MEAL 180 tablet 2   cetirizine (ZYRTEC) 10 MG  tablet Take 10 mg by mouth daily.     diclofenac Sodium (VOLTAREN) 1 % GEL Apply 2 g topically as needed (sholduer pain).     diltiazem (CARDIZEM CD) 180 MG 24 hr capsule Take 1 capsule (180 mg total) by mouth daily. 90 capsule 1   diltiazem (CARDIZEM) 30 MG tablet Take 1 tablet every 4 hours AS NEEDED for heart rate >100 45 tablet 1   fluticasone (FLONASE) 50 MCG/ACT nasal spray Place 2 sprays into both nostrils daily.     hydrochlorothiazide (HYDRODIURIL) 25 MG tablet Take 1 tablet (25 mg total) by mouth daily. 90 tablet 1   losartan (COZAAR) 100 MG tablet Take 1 tablet (100 mg total) by mouth daily. 90 tablet 3   omeprazole (PRILOSEC) 40 MG capsule TAKE 1 CAPSULE(40 MG) BY MOUTH DAILY 90 capsule 3   levalbuterol (XOPENEX HFA) 45 MCG/ACT inhaler Inhale 2 puffs into the lungs every 6 (six) hours as needed for wheezing. (Patient not taking: Reported on 09/17/2021) 1 each 0   No current facility-administered medications for this visit.    Allergies:   Ace inhibitors   Social History:  The patient  reports that he has never smoked. He has never used smokeless tobacco. He reports current alcohol use of about 7.0 standard drinks per week. He reports that he does not use drugs.   Family History:  The patient's family history includes CAD in his maternal uncle; Cancer in  his father and mother; Hypertension in his mother; Kidney cancer in his maternal grandmother.    ROS:  Please see the history of present illness.   Otherwise, review of systems is positive for none.   All other systems are reviewed and negative.    PHYSICAL EXAM: VS:  BP (!) 150/90    Pulse 66    Ht 6' (1.829 m)    Wt 190 lb 3.2 oz (86.3 kg)    SpO2 98%    BMI 25.80 kg/m  , BMI Body mass index is 25.8 kg/m. GEN: Well nourished, well developed, in no acute distress  HEENT: normal  Neck: no JVD, carotid bruits, or masses Cardiac: RRR; no murmurs, rubs, or gallops,no edema  Respiratory:  clear to auscultation bilaterally, normal  work of breathing GI: soft, nontender, nondistended, + BS MS: no deformity or atrophy  Skin: warm and dry Neuro:  Strength and sensation are intact Psych: euthymic mood, full affect  EKG:  EKG is ordered today. Personal review of the ekg ordered shows sinus rhythm, lateral T wave inversions, rate 66  Recent Labs: 06/25/2021: ALT 25; Magnesium 2.5; TSH 3.342 08/01/2021: BUN 12; Creatinine, Ser 0.94; Hemoglobin 14.5; Platelets 243; Potassium 3.8; Sodium 137    Lipid Panel     Component Value Date/Time   CHOL 186 06/25/2021 0409   TRIG 179 (H) 06/25/2021 0409   HDL 52 06/25/2021 0409   CHOLHDL 3.6 06/25/2021 0409   VLDL 36 06/25/2021 0409   LDLCALC 98 06/25/2021 0409     Wt Readings from Last 3 Encounters:  09/17/21 190 lb 3.2 oz (86.3 kg)  08/15/21 190 lb (86.2 kg)  08/01/21 198 lb (89.8 kg)      Other studies Reviewed: Additional studies/ records that were reviewed today include: TTE 06/15/21  Review of the above records today demonstrates:   1. Left ventricular ejection fraction by 3D volume is 67 %. The left  ventricle has normal function. The left ventricle has no regional wall  motion abnormalities. There is mild asymmetric left ventricular  hypertrophy of the posterior-lateral segment.  Left ventricular diastolic parameters are consistent with Grade I  diastolic dysfunction (impaired relaxation). The average left ventricular  global longitudinal strain is -23.3 %. The global longitudinal strain is  normal.   2. Right ventricular systolic function is normal. The right ventricular  size is normal. Tricuspid regurgitation signal is inadequate for assessing  PA pressure.   3. The mitral valve is normal in structure. Trivial mitral valve  regurgitation. No evidence of mitral stenosis.   4. The aortic valve is tricuspid. Aortic valve regurgitation is not  visualized. No aortic stenosis is present.   5. The inferior vena cava is normal in size with greater than 50%   respiratory variability, suggesting right atrial pressure of 3 mmHg.   Cardiac monitor 08/23/2021 personally reviewed The basic rhythm is normal sinus with an average HR of 63 bpm Atrial fibrillation occurs with an overall burden of 4% No high-grade heart block or pathologic pauses, sinus bradycardia with ventricular rates as low as 37 bpm There are rare PVC's and rare supraventricular beats  ASSESSMENT AND PLAN:  1.  Paroxysmal atrial fibrillation: Currently on Eliquis 5 mg twice daily, diltiazem 180 mg daily.  CHA2DS2-VASc of 1.  He would like ablation eventually, but he is not quite ready for that yet.  He does have sleep apnea and he is working to get a CPAP.  He would like to be treated for sleep  apnea potentially before ablation.  We Maebry Obrien hold off for now.  I have told him that if he changes his mind and wants ablation prior to our next appointment to call us and we Sumaya Riedesel schedule.  2.  Obstructive sleep apnea: CPAP compliance encouraged.  3.  Hypertension: Elevated today.  Usually well controlled.  Plan per primary care.  Current medicines are reviewed at length with the patient today.   The patient does not have concerns regarding his medicines.  The following changes were made today:  none  Labs/ tests ordered today include:  Orders Placed This Encounter  Procedures   EKG 12-Lead     Disposition:   FU with Isiac Breighner 3 months  Signed, Waleed Dettman Meredith Leeds, MD  09/17/2021 9:37 AM     [address] [place] [address] [phone] (office) [phone] (fax)

## 2021-09-19 ENCOUNTER — Ambulatory Visit: Payer: 59 | Admitting: Nurse Practitioner

## 2021-09-19 ENCOUNTER — Telehealth: Payer: Self-pay | Admitting: Cardiology

## 2021-09-19 HISTORY — PX: COLONOSCOPY: SHX174

## 2021-09-19 NOTE — Telephone Encounter (Signed)
Returned a call to the patient. He questioned if there has been any change in the wait for CPAP machines. He was informed by me that I have not been notified of any change, however there are some machines available without the modems. Patient's are now being given a choice from the DME companies. The difference was explained to the patient. He asked me about ordering a machine online. I told him that we have had some patients to do this, however I don't know how this works with their insurance. He was given contact information to [facility] to contact to check his status. Patient was told if he decides to purchase a machine online he will need to contact me for machine type and pressure information. Patient voiced his understanding.

## 2021-09-19 NOTE — Telephone Encounter (Signed)
Patient calling wanting to know how long it would take to get his cpap machine. Patient states if he dont answer please leave message on VM. Please advise

## 2021-09-21 ENCOUNTER — Telehealth: Payer: Self-pay | Admitting: Physician Assistant

## 2021-09-21 ENCOUNTER — Emergency Department (HOSPITAL_COMMUNITY)
Admission: EM | Admit: 2021-09-21 | Discharge: 2021-09-21 | Disposition: A | Discharge status: Home / Self Care | Payer: 59 | Attending: Emergency Medicine | Admitting: Emergency Medicine

## 2021-09-21 ENCOUNTER — Other Ambulatory Visit: Payer: Self-pay

## 2021-09-21 ENCOUNTER — Emergency Department (HOSPITAL_COMMUNITY): Payer: 59

## 2021-09-21 ENCOUNTER — Encounter (HOSPITAL_COMMUNITY): Payer: Self-pay

## 2021-09-21 DIAGNOSIS — Z7901 Long term (current) use of anticoagulants: Secondary | ICD-10-CM | POA: Insufficient documentation

## 2021-09-21 DIAGNOSIS — E876 Hypokalemia: Secondary | ICD-10-CM | POA: Diagnosis not present

## 2021-09-21 DIAGNOSIS — Z79899 Other long term (current) drug therapy: Secondary | ICD-10-CM | POA: Diagnosis not present

## 2021-09-21 DIAGNOSIS — J45909 Unspecified asthma, uncomplicated: Secondary | ICD-10-CM | POA: Insufficient documentation

## 2021-09-21 DIAGNOSIS — R42 Dizziness and giddiness: Secondary | ICD-10-CM | POA: Diagnosis present

## 2021-09-21 DIAGNOSIS — I480 Paroxysmal atrial fibrillation: Secondary | ICD-10-CM | POA: Diagnosis not present

## 2021-09-21 DIAGNOSIS — I10 Essential (primary) hypertension: Secondary | ICD-10-CM | POA: Insufficient documentation

## 2021-09-21 LAB — CBC
HCT: 43.2 % (ref 39.0–52.0)
Hemoglobin: 15.2 g/dL (ref 13.0–17.0)
MCH: 30.6 pg (ref 26.0–34.0)
MCHC: 35.2 g/dL (ref 30.0–36.0)
MCV: 87.1 fL (ref 80.0–100.0)
Platelets: 249 10*3/uL (ref 150–400)
RBC: 4.96 MIL/uL (ref 4.22–5.81)
RDW: 12.5 % (ref 11.5–15.5)
WBC: 7.4 10*3/uL (ref 4.0–10.5)
nRBC: 0 % (ref 0.0–0.2)

## 2021-09-21 LAB — BASIC METABOLIC PANEL
Anion gap: 7 (ref 5–15)
BUN: 17 mg/dL (ref 8–23)
CO2: 27 mmol/L (ref 22–32)
Calcium: 8.9 mg/dL (ref 8.9–10.3)
Chloride: 101 mmol/L (ref 98–111)
Creatinine, Ser: 0.91 mg/dL (ref 0.61–1.24)
GFR, Estimated: >60 mL/min (ref >60)
Glucose, Bld: 109 mg/dL — ABNORMAL HIGH (ref 70–99)
Potassium: 3.3 mmol/L — ABNORMAL LOW (ref 3.5–5.1)
Sodium: 135 mmol/L (ref 135–145)

## 2021-09-21 LAB — TROPONIN I (HIGH SENSITIVITY): Troponin I (High Sensitivity): 21 ng/L — ABNORMAL HIGH (ref <18)

## 2021-09-21 LAB — HM COLONOSCOPY

## 2021-09-21 MED ORDER — POTASSIUM CHLORIDE CRYS ER 20 MEQ PO TBCR
40.0000 meq | EXTENDED_RELEASE_TABLET | Freq: Once | ORAL | Status: AC
  Administered 2021-09-21: 40 meq via ORAL

## 2021-09-21 NOTE — ED Provider Notes (Signed)
[facility] DEPT Provider Note   CSN: [id number] Arrival date & time: 09/21/21  2125     History  Chief Complaint  Patient presents with   Tachycardia   Dizziness    Clarence Perez is a 61 y.o. male.   Dizziness Associated symptoms: no shortness of breath    Patient has a history of asthma, reflux, hypertension, atrial fibrillation and sleep apnea .  patient presented to the ED with complaints of palpitations and lightheadedness today.  Patient had a colonoscopy procedure today.  Sometime later in the afternoon the patient started to feel that it was heart was beating irregularly and racing.  He felt like he was back in A. fib.  He was feeling lightheaded.  Patient tried taking some extra potassium as well as his Cardizem.  Patient was diagnosed recently with A. fib in November of last year.  Patient has been compliant with his medications including his anticoagulation.  He is followed in the cardiology clinic.  According to the records the patient was last seen on January 30.  Since the patient arrived in the ED he has felt like his heart rate has slowed down.  Right now he is not feeling lightheaded and is not having any complaints.  Home Medications Prior to Admission medications   Medication Sig Start Date End Date Taking? Authorizing Provider  albuterol (VENTOLIN HFA) 108 (90 Base) MCG/ACT inhaler Inhale 2 puffs into the lungs every 4 (four) hours as needed for shortness of breath.    [provider]  apixaban (ELIQUIS) 5 MG TABS tablet Take 1 tablet (5 mg total) by mouth 2 (two) times daily. 07/16/21   [name], [name], PA  azelastine (ASTELIN) 0.1 % nasal spray USE 2 SPRAYS IN EACH NOSTRIL TWICE DAILY AS DIRECTED 02/16/21   Cirigliano, Mary K, DO  carvedilol (COREG) 12.5 MG tablet TAKE 1 TABLET(12.5 MG) BY MOUTH TWICE DAILY WITH A MEAL 07/13/21   Nche, Charlene Brooke, NP  cetirizine (ZYRTEC) 10 MG tablet Take 10 mg by mouth daily. 10/20/09    [provider]  diclofenac Sodium (VOLTAREN) 1 % GEL Apply 2 g topically as needed (sholduer pain). 03/11/15   [provider]  diltiazem (CARDIZEM CD) 180 MG 24 hr capsule Take 1 capsule (180 mg total) by mouth daily. 08/16/21   [name], [name], PA  diltiazem (CARDIZEM) 30 MG tablet Take 1 tablet every 4 hours AS NEEDED for heart rate >100 07/17/21   [name], [name], PA  fluticasone (FLONASE) 50 MCG/ACT nasal spray Place 2 sprays into both nostrils daily. 03/23/14   [provider]  hydrochlorothiazide (HYDRODIURIL) 25 MG tablet Take 1 tablet (25 mg total) by mouth daily. 08/01/21   [name], [name], PA  levalbuterol (XOPENEX HFA) 45 MCG/ACT inhaler Inhale 2 puffs into the lungs every 6 (six) hours as needed for wheezing. Patient not taking: Reported on 09/17/2021 06/25/21   Reubin Milan, MD  losartan (COZAAR) 100 MG tablet Take 1 tablet (100 mg total) by mouth daily. 06/24/21   Nche, Charlene Brooke, NP  omeprazole (PRILOSEC) 40 MG capsule TAKE 1 CAPSULE(40 MG) BY MOUTH DAILY 02/16/21   Cirigliano, Garvin Fila, DO      Allergies    Ace inhibitors    Review of Systems   Review of Systems  Constitutional:  Negative for fever.  Respiratory:  Negative for shortness of breath.   Neurological:  Positive for dizziness.   Physical Exam Updated Vital Signs BP 121/74  Pulse (!) 57    Temp 98 F (36.7 C) (Oral)    Resp 13    Ht 1.829 m (6')    Wt 86.2 kg    SpO2 98%    BMI 25.77 kg/m  Physical Exam Vitals and nursing note reviewed.  Constitutional:      General: He is not in acute distress.    Appearance: He is well-developed.  HENT:     Head: Normocephalic and atraumatic.     Right Ear: External ear normal.     Left Ear: External ear normal.  Eyes:     General: No scleral icterus.       Right eye: No discharge.        Left eye: No discharge.     Conjunctiva/sclera: Conjunctivae normal.  Neck:     Trachea: No tracheal deviation.  Cardiovascular:     Rate and  Rhythm: Normal rate and regular rhythm.  Pulmonary:     Effort: Pulmonary effort is normal. No respiratory distress.     Breath sounds: Normal breath sounds. No stridor. No wheezing or rales.  Abdominal:     General: Bowel sounds are normal. There is no distension.     Palpations: Abdomen is soft.     Tenderness: There is no abdominal tenderness. There is no guarding or rebound.  Musculoskeletal:        General: No tenderness or deformity.     Cervical back: Neck supple.  Skin:    General: Skin is warm and dry.     Findings: No rash.  Neurological:     General: No focal deficit present.     Mental Status: He is alert.     Cranial Nerves: No cranial nerve deficit (no facial droop, extraocular movements intact, no slurred speech).     Sensory: No sensory deficit.     Motor: No abnormal muscle tone or seizure activity.     Coordination: Coordination normal.  Psychiatric:        Mood and Affect: Mood normal.    ED Results / Procedures / Treatments   Labs (all labs ordered are listed, but only abnormal results are displayed) Labs Reviewed  BASIC METABOLIC PANEL - Abnormal; Notable for the following components:      Result Value   Potassium 3.3 (*)    Glucose, Bld 109 (*)    All other components within normal limits  TROPONIN I (HIGH SENSITIVITY) - Abnormal; Notable for the following components:   Troponin I (High Sensitivity) 21 (*)    All other components within normal limits  CBC    EKG EKG Interpretation  Date/Time:  Friday September 21 2021 21:56:16 EST Ventricular Rate:  81 PR Interval:  159 QRS Duration: 93 QT Interval:  333 QTC Calculation: 387 R Axis:   30 Text Interpretation: Sinus rhythm Abnormal R-wave progression, early transition Repol abnrm suggests ischemia, lateral leads No significant change since last tracing Confirmed by Dorie Rank [phone]) on 09/21/2021 10:10:24 PM  Radiology DG Chest 2 View  Result Date: 09/21/2021 CLINICAL DATA:  Chest pain EXAM:  CHEST - 2 VIEW COMPARISON:  06/25/2021 FINDINGS: Heart and mediastinal contours are within normal limits. No focal opacities or effusions. No acute bony abnormality. IMPRESSION: No active cardiopulmonary disease. Electronically Signed   By: Rolm Baptise M.D.   On: 09/21/2021 23:05    Procedures .1-3 Lead EKG Interpretation Performed by: Dorie Rank, MD Authorized by: Dorie Rank, MD     Interpretation: normal  ECG rate:  62   ECG rate assessment: normal     Rhythm: sinus rhythm     Ectopy: none     Conduction: normal   Comments:     10:42 PM     Medications Ordered in ED Medications  potassium chloride SA (KLOR-CON M) CR tablet 40 mEq (40 mEq Oral Given 09/21/21 2324)    ED Course/ Medical Decision Making/ A&P Clinical Course as of 09/21/21 2337  Fri Sep 21, 2021  2321 CBC Normal [JK]  8832 Basic metabolic panel(!) Potassium slightly decreased [JK]  2322 Troponin I (High Sensitivity)(!) Troponin slightly elevated 21 but I doubt this is clinically significant likely related to his tachycardia earlier [JK]  2323 Reviewed patient's cardia mobile tracings.  Patient appeared to be in rapid A. fib with rates up into the 160s [JK]    Clinical Course User Index [JK] Dorie Rank, MD                           Medical Decision Making Amount and/or Complexity of Data Reviewed Labs: ordered. Decision-making details documented in ED Course. Radiology: ordered.  Risk Prescription drug management.   Patient presented with complaints of recurrent A. fib.  Patient felt his heart racing and irregular at home today.  He recently did have a colonoscopy.  In the ED he is back in sinus rhythm.  I reviewed his outpatient cardiac monitor tracings and he was in rapid A. fib.  Patient has been monitored in the ED.  Heart rate is stabilized.  Potassium was slightly decreased and I will give him a dose of oral potassium.    Evaluation and diagnostic testing in the emergency department does not  suggest an emergent condition requiring admission or immediate intervention beyond what has been performed at this time.  The patient is safe for discharge and has been instructed to return immediately for worsening symptoms, change in symptoms or any other concerns.         Final Clinical Impression(s) / ED Diagnoses Final diagnoses:  Paroxysmal atrial fibrillation ([place])  Hypokalemia    Rx / DC Orders ED Discharge Orders     None         Dorie Rank, MD 09/21/21 2337

## 2021-09-21 NOTE — Discharge Instructions (Addendum)
Continue your current medications.  Follow-up with your cardiologist.

## 2021-09-21 NOTE — ED Provider Triage Note (Signed)
Emergency Medicine Provider Triage Evaluation Note  Clarence Perez , a 61 y.o. male  was evaluated in triage.  Pt complains of dizziness and heart racing since this afternoon.  Patient was recently diagnosed with A-fib, states that he has been seen by cardiology placed on carvedilol.  Patient states that he had a colonoscopy today, had bowel prep yesterday.  Patient states after colonoscopy that he noticed that his heart was racing.  Review of Systems  Positive: Racing heart Negative: Dizziness, lightheadedness, shortness of breath, chest pain, lower extremity swelling  Physical Exam  There were no vitals taken for this visit. Gen:   Awake, no distress   Resp:  Normal effort  MSK:   Moves extremities without difficulty  Other:    Medical Decision Making  Medically screening exam initiated at 9:56 PM.  Appropriate orders placed.  Clarence Perez was informed that the remainder of the evaluation will be completed by another provider, this initial triage assessment does not replace that evaluation, and the importance of remaining in the ED until their evaluation is complete.     Azucena Cecil, PA-C 09/21/21 2157

## 2021-09-21 NOTE — Telephone Encounter (Signed)
Patient is a physical therapy physician.  He has a history of atrial fibrillation.  He is on carvedilol, 180 mg daily of diltiazem and Eliquis.  He held Eliquis recently for 2 days prior to his GI procedure today.  Unfortunately after the GI procedure he went into atrial fibrillation with elevated heart rate.  He has taken 2 doses of short acting as needed dose of diltiazem.  Last dose was around 4:30 PM today.  I instructed the patient to take another dose of short acting diltiazem.  He will wait about 2 hours and recheck his blood pressure and heart rate.  If his heart rate is still elevated and that his systolic blood pressure is greater than 110, I would be in favor for him to take another long-acting 180 mg of Cardizem CD.  If we can get his heart rate under good control using medical therapy, he can potentially arrange earlier visit next week to discuss cardioversion.  However if his heart rate is persistently over 120 bpm despite medical therapy, he will need to come to the emergency room

## 2021-09-21 NOTE — ED Triage Notes (Signed)
Pt reports with tachycardia and dizziness since today after his colonoscopy. Pt states that he has a hx of afib.

## 2021-09-24 ENCOUNTER — Telehealth (HOSPITAL_COMMUNITY): Payer: Self-pay

## 2021-09-24 NOTE — Telephone Encounter (Signed)
Contacted patient regarding his A-fib episode on 2/3. Called to check on him to see how he is feeling. He states he seems to be doing okay and wants to wait and see Dr. Curt Bears in May for further evaluation. This episode is more than likely related to low potassium and the prep prior to his colonoscopy. Patient will contact A-fib clinic if he has concerns. Consulted with patient and he verbalized understanding.

## 2021-09-27 ENCOUNTER — Telehealth: Payer: Self-pay | Admitting: *Deleted

## 2021-09-27 ENCOUNTER — Encounter: Payer: Self-pay | Admitting: Cardiology

## 2021-09-27 NOTE — Telephone Encounter (Signed)
Patient called in and wanted me to ask for Dr Claiborne Billings to return a call to him. He has some questions concerning his sleep study as well as OSA. Message. 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 Message will be routed to Dr Claiborne Billings.

## 2021-09-29 ENCOUNTER — Encounter: Payer: Self-pay | Admitting: Nurse Practitioner

## 2021-09-29 ENCOUNTER — Other Ambulatory Visit: Payer: Self-pay | Admitting: Nurse Practitioner

## 2021-09-29 DIAGNOSIS — J309 Allergic rhinitis, unspecified: Secondary | ICD-10-CM | POA: Insufficient documentation

## 2021-09-29 DIAGNOSIS — H1013 Acute atopic conjunctivitis, bilateral: Secondary | ICD-10-CM

## 2021-09-29 DIAGNOSIS — J3089 Other allergic rhinitis: Secondary | ICD-10-CM

## 2021-10-01 NOTE — Telephone Encounter (Signed)
Called pt to follow  up on this. Pt appreciates the call back on this, but informs me that he actually spoke with Dr. Claiborne Billings on Friday, all questions were answered and he does not need a referral at this time. He appreciates my follow up on this.

## 2021-10-02 NOTE — Telephone Encounter (Signed)
Called patient on September 28, 2021

## 2021-10-08 ENCOUNTER — Encounter: Payer: Self-pay | Admitting: Nurse Practitioner

## 2021-10-09 ENCOUNTER — Other Ambulatory Visit: Payer: Self-pay

## 2021-10-09 MED ORDER — OMEPRAZOLE 40 MG PO CPDR: DELAYED_RELEASE_CAPSULE | ORAL | 0 refills | Status: DC

## 2021-10-09 NOTE — Telephone Encounter (Signed)
Chart supports Rx Last seen 05/2021 No future appointments scheduled but patient informed to schedule a follow up appointment.

## 2021-10-18 ENCOUNTER — Telehealth: Payer: Self-pay | Admitting: Cardiovascular Disease

## 2021-10-18 NOTE — Telephone Encounter (Signed)
[facility] called in regards to this patient. He is having issues with his machine and was told that his compliance appointment is not until 30-90 days after receiving the machine in which did not make him happy. He sent an email to [employer] stating how unhappy he was. He is requesting a call from Dr. Claiborne Billings personally per the rep at [facility]. Per the rep, he is having a lot of anxiety. Dr. Evette Georges next sleep clinic day is not until 01/22/22. Routing this message to Dr. Claiborne Billings and Kearney Hard, LPN his covering nurse to make sure that this patient receives a personal call from Dr. Claiborne Billings.  ?

## 2021-10-18 NOTE — Telephone Encounter (Signed)
Patient's number is [phone] ?

## 2021-10-28 ENCOUNTER — Encounter: Payer: Self-pay | Admitting: Cardiology

## 2021-10-29 ENCOUNTER — Telehealth (HOSPITAL_COMMUNITY): Payer: Self-pay | Admitting: *Deleted

## 2021-10-29 NOTE — Telephone Encounter (Signed)
Patient called in stating he is having increasing episodes of afib and is ready to proceed with ablation. Pt to use PRN cardizem for episodes but will try just a half tablet to limit bradycardia when back in rhythm.  Instructed pt I will forward to Sherri RN to contact regarding scheduling ablation per Dr. Curt Bears last note. Pt in agreement and will await return call.  ?

## 2021-10-30 ENCOUNTER — Encounter: Payer: Self-pay | Admitting: Family Medicine

## 2021-10-30 ENCOUNTER — Other Ambulatory Visit: Payer: Self-pay

## 2021-10-30 ENCOUNTER — Ambulatory Visit: Payer: 59 | Admitting: Family Medicine

## 2021-10-30 VITALS — BP 136/78 | HR 79 | Temp 97.6°F | Ht 72.0 in | Wt 191.4 lb

## 2021-10-30 DIAGNOSIS — F5102 Adjustment insomnia: Secondary | ICD-10-CM | POA: Diagnosis not present

## 2021-10-30 MED ORDER — ESZOPICLONE 1 MG PO TABS: 1.0000 mg | ORAL_TABLET | Freq: Every evening | ORAL | 0 refills | Status: DC | PRN

## 2021-10-30 NOTE — Progress Notes (Signed)
?[facility] ?[facility] ?[place] ?[place] [address] ?Dept: [phone] ?Dept Fax: [phone] ? ?Office Visit ? ?Subjective:  ? ? Patient ID: Clarence Perez, male    DOB: October 16, 1960, 61 y.o..   MRN: [id number] ? ?Chief Complaint  ?Patient presents with  ? Acute Visit  ?  C/o having trouble sleeping x 2 months. He has tried Benadryl with little relief.   ? ? ?History of Present Illness: ? ?Patient is in today for complaining of issues with sleep. 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 He was diagnosed with atrial fibrillation and will be undergoing an ablation at some point. He also was diagnosed with OSA. He now has a CPAP, but notes the adjustment to using this has been pretty difficult. He now is having some ongoing insomnia issues. He has both difficulty initiating and maintaining sleep.  He does admit that some of the sleep issues may be stress or anxiety related. However, he has the other physical issues with the discomfort of his mask and the drying effect of his eyes and mouth. He is using a variety of agents for the eyes and mouth to try and improve moisture. He notes that his wife does have a prescription for Xanax. He has taken an occasional 0.25-0.5 mg dose, but would prefer to stay away from use of BZDs. He had done some research and notes some evidence for use of eszopiclone (Lunesta) for improving the AHI in sleep apnea. ? ?Past Medical History: ?Patient Active Problem List  ? Diagnosis Date Noted  ? Adjustment insomnia 10/30/2021  ? Allergic rhinitis 09/29/2021  ? Paroxysmal atrial fibrillation ([place]) 07/02/2021  ? Atrial fibrillation with rapid ventricular response ([place]) 06/25/2021  ? Grade I diastolic dysfunction 33/82/5053  ? Hyperglycemia   ? Essential hypertension 11/04/2018  ? Hyperlipidemia 11/04/2018  ? Gastro-esophageal reflux disease with esophagitis 12/04/2012  ? History of prostate cancer  06/02/2012  ? Asthma 09/20/2011  ? Obstructive sleep apnea 09/20/2011  ? ?Past Surgical History:  ?Procedure Laterality Date  ? prostate cryoblation    ? ?Family History  ?Problem Relation Age of Onset  ? Cancer Mother   ?     colon  ? Hypertension Mother   ? Cancer Father   ?     prostate and kidney  ? CAD Maternal Uncle   ? Kidney cancer Maternal Grandmother   ? ?Outpatient Medications Prior to Visit  ?Medication Sig Dispense Refill  ? albuterol (VENTOLIN HFA) 108 (90 Base) MCG/ACT inhaler Inhale 2 puffs into the lungs every 4 (four) hours as needed for shortness of breath.    ? apixaban (ELIQUIS) 5 MG TABS tablet Take 1 tablet (5 mg total) by mouth 2 (two) times daily. 180 tablet 2  ? azelastine (ASTELIN) 0.1 % nasal spray USE 2 SPRAYS IN EACH NOSTRIL TWICE DAILY AS DIRECTED 90 mL 3  ? carvedilol (COREG) 12.5 MG tablet TAKE 1 TABLET(12.5 MG) BY MOUTH TWICE DAILY WITH A MEAL 180 tablet 2  ? diclofenac Sodium (VOLTAREN) 1 % GEL Apply 2 g topically as needed (sholduer pain).    ? diltiazem (CARDIZEM CD) 180 MG 24 hr capsule Take 1 capsule (180 mg total) by mouth daily. 90 capsule 1  ? diltiazem (CARDIZEM) 30 MG tablet Take 1 tablet every 4 hours AS NEEDED for heart rate >100 45 tablet 1  ? hydrochlorothiazide (HYDRODIURIL) 25 MG tablet Take 1 tablet (25 mg total) by mouth  daily. 90 tablet 1  ? levocetirizine (XYZAL) 5 MG tablet Take 1 tablet (5 mg total) by mouth every evening.    ? losartan (COZAAR) 100 MG tablet Take 1 tablet (100 mg total) by mouth daily. 90 tablet 3  ? montelukast (SINGULAIR) 10 MG tablet Take 1 tablet (10 mg total) by mouth at bedtime. 30 tablet 3  ? omeprazole (PRILOSEC) 40 MG capsule TAKE 1 CAPSULE(40 MG) BY MOUTH DAILY 90 capsule 0  ? fluticasone (FLONASE) 50 MCG/ACT nasal spray     ? ketotifen (ZADITOR) 0.025 % ophthalmic solution Apply 1 drop to eye 2 (two) times daily. 5 mL 0  ? ?No facility-administered medications prior to visit.  ? ?Allergies  ?Allergen Reactions  ? Ace Inhibitors  Cough  ?   ?Objective:  ? ?Today's Vitals  ? 10/30/21 1614  ?BP: 136/78  ?Pulse: 79  ?Temp: 97.6 ?F (36.4 ?C)  ?TempSrc: Temporal  ?SpO2: 99%  ?Weight: 191 lb 6.4 oz (86.8 kg)  ?Height: 6' (1.829 m)  ? ?Body mass index is 25.96 kg/m?.  ? ?General: Well developed, well nourished. No acute distress. ?Psych: Alert and oriented. Normal mood and affect. ? ?Health Maintenance Due  ?Topic Date Due  ? HIV Screening  Never done  ? Hepatitis C Screening  Never done  ? Zoster Vaccines- Shingrix (1 of 2) Never done  ? TETANUS/TDAP  07/21/2019  ?   ?Assessment & Plan:  ? ?1. 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 We did touch on sleep hygiene issues. Based on his research, we will try Lunesta 1 mg at bedtime for one month. If symptoms are not improved in 2 weeks, he can step up to a 2 mg dose. We will consider a taper at the end of the month. I will see him back prior to this. ? ?- eszopiclone (LUNESTA) 1 MG TABS tablet; Take 1 tablet (1 mg total) by mouth at bedtime as needed for sleep. Take immediately before bedtime  Dispense: 30 tablet; Refill: 0 ? ?Return in about 4 weeks (around 11/27/2021) for Reassessment, TOC.  ? ?Haydee Salter, MD ?

## 2021-11-07 ENCOUNTER — Ambulatory Visit: Payer: 59 | Admitting: Cardiology

## 2021-11-07 ENCOUNTER — Other Ambulatory Visit: Payer: Self-pay

## 2021-11-07 ENCOUNTER — Encounter: Payer: Self-pay | Admitting: Cardiology

## 2021-11-07 VITALS — BP 116/68 | HR 62 | Ht 72.0 in | Wt 193.4 lb

## 2021-11-07 DIAGNOSIS — I480 Paroxysmal atrial fibrillation: Secondary | ICD-10-CM

## 2021-11-07 DIAGNOSIS — G4733 Obstructive sleep apnea (adult) (pediatric): Secondary | ICD-10-CM

## 2021-11-07 DIAGNOSIS — I10 Essential (primary) hypertension: Secondary | ICD-10-CM | POA: Diagnosis not present

## 2021-11-07 NOTE — Patient Instructions (Addendum)
Medication Instructions:  ?Your physician recommends that you continue on your current medications as directed. Please refer to the Current Medication list given to you today. ?*If you need a refill on your cardiac medications before your next appointment, please call your pharmacy* ? ?Lab Work: ?None ordered. ?If you have labs (blood work) drawn today and your tests are completely normal, you will receive your results only by: ?MyChart Message (if you have MyChart) OR ?A paper copy in the mail ?If you have any lab test that is abnormal or we need to change your treatment, we will call you to review the results. ? ?Testing/Procedures: ?Your physician has requested that you have cardiac CT. Cardiac computed tomography (CT) is a painless test that uses an x-ray machine to take clear, detailed pictures of your heart.  ? ?Your physician has recommended that you have an ablation. Catheter ablation is a medical procedure used to treat some cardiac arrhythmias (irregular heartbeats). During catheter ablation, a long, thin, flexible tube is put into a blood vessel in your groin (upper thigh), or neck. This tube is called an ablation catheter. It is then guided to your heart through the blood vessel. Radio frequency waves destroy small areas of heart tissue where abnormal heartbeats may cause an arrhythmia to start. Please see the instruction sheet given to you today. ? ?Follow-Up: ? ?SEE INSTRUCTION LETTER ? ?Cardiac Ablation ?Cardiac ablation is a procedure to destroy, or ablate, a small amount of heart tissue in very specific places. The heart has many electrical connections. Sometimes these connections are abnormal and can cause the heart to beat very fast or irregularly. Ablating some of the areas that cause problems can improve the heart's rhythm or return it to normal. Ablation may be done for people who: ?Have Wolff-Parkinson-White syndrome. ?Have fast heart rhythms (tachycardia). ?Have taken medicines for an  abnormal heart rhythm (arrhythmia) that were not effective or caused side effects. ?Have a high-risk heartbeat that may be life-threatening. ?During the procedure, a small incision is made in the neck or the groin, and a long, thin tube (catheter) is inserted into the incision and moved to the heart. Small devices (electrodes) on the tip of the catheter will send out electrical currents. A type of X-ray (fluoroscopy) will be used to help guide the catheter and to provide images of the heart. ?Tell a health care provider about: ?Any allergies you have. ?All medicines you are taking, including vitamins, herbs, eye drops, creams, and over-the-counter medicines. ?Any problems you or family members have had with anesthetic medicines. ?Any blood disorders you have. ?Any surgeries you have had. ?Any medical conditions you have, such as kidney failure. ?Whether you are pregnant or may be pregnant. ?What are the risks? ?Generally, this is a safe procedure. However, problems may occur, including: ?Infection. ?Bruising and bleeding at the catheter insertion site. ?Bleeding into the chest, especially into the sac that surrounds the heart. This is a serious complication. ?Stroke or blood clots. ?Damage to nearby structures or organs. ?Allergic reaction to medicines or dyes. ?Need for a permanent pacemaker if the normal electrical system is damaged. A pacemaker is a small computer that sends electrical signals to the heart and helps your heart beat normally. ?The procedure not being fully effective. This may not be recognized until months later. Repeat ablation procedures are sometimes done. ?What happens before the procedure? ?Medicines ?Ask your health care provider about: ?Changing or stopping your regular medicines. This is especially important if you are taking diabetes medicines  or blood thinners. ?Taking medicines such as aspirin and ibuprofen. These medicines can thin your blood. Do not take these medicines unless your  health care provider tells you to take them. ?Taking over-the-counter medicines, vitamins, herbs, and supplements. ?General instructions ?Follow instructions from your health care provider about eating or drinking restrictions. ?Plan to have someone take you home from the hospital or clinic. ?If you will be going home right after the procedure, plan to have someone with you for 24 hours. ?Ask your health care provider what steps will be taken to prevent infection. ?What happens during the procedure? ? ?An IV will be inserted into one of your veins. ?You will be given a medicine to help you relax (sedative). ?The skin on your neck or groin will be numbed. ?An incision will be made in your neck or your groin. ?A needle will be inserted through the incision and into a large vein in your neck or groin. ?A catheter will be inserted into the needle and moved to your heart. ?Dye may be injected through the catheter to help your surgeon see the area of the heart that needs treatment. ?Electrical currents will be sent from the catheter to ablate heart tissue in desired areas. There are three types of energy that may be used to do this: ?Heat (radiofrequency energy). ?Laser energy. ?Extreme cold (cryoablation). ?When the tissue has been ablated, the catheter will be removed. ?Pressure will be held on the insertion area to prevent a lot of bleeding. ?A bandage (dressing) will be placed over the insertion area. ?The exact procedure may vary among health care providers and hospitals. ?What happens after the procedure? ?Your blood pressure, heart rate, breathing rate, and blood oxygen level will be monitored until you leave the hospital or clinic. ?Your insertion area will be monitored for bleeding. You will need to lie still for a few hours to ensure that you do not bleed from the insertion area. ?Do not drive for 24 hours or as long as told by your health care provider. ?Summary ?Cardiac ablation is a procedure to destroy, or  ablate, a small amount of heart tissue using an electrical current. This procedure can improve the heart rhythm or return it to normal. ?Tell your health care provider about any medical conditions you may have and all medicines you are taking to treat them. ?This is a safe procedure, but problems may occur. Problems may include infection, bruising, damage to nearby organs or structures, or allergic reactions to medicines. ?Follow your health care provider's instructions about eating and drinking before the procedure. You may also be told to change or stop some of your medicines. ?After the procedure, do not drive for 24 hours or as long as told by your health care provider. ?This information is not intended to replace advice given to you by your health care provider. Make sure you discuss any questions you have with your health care provider. ?Document Revised: 06/14/2019 Document Reviewed: 06/14/2019 ?Elsevier Patient Education ? [place]. ? ?

## 2021-11-07 NOTE — Progress Notes (Signed)
? ?Electrophysiology Office Note ? ? ?Date:  11/07/2021  ? ?ID:  Clarence Perez, DOB Nov 06, 1960, MRN [id number] ? ?PCP:  Clarence Salter, MD  ?Cardiologist:  Clarence Perez ?Primary Electrophysiologist:  Clarence Agro Meredith Leeds, MD   ? ?Chief Complaint: AF ?  ?History of Present Illness: ?Clarence Perez is a 61 y.o. male who is being seen today for the evaluation of AF at the request of Perez, Clarence Brooke, NP. Presenting today for electrophysiology evaluation. ? ?He has a history significant for hypertension, GERD, prostate cancer, OSA, atrial fibrillation.  He presented to his primary physician's office in atrial fibrillation.  He had been having palpitations for the last few years.  He has symptoms of palpitations and shortness of breath as well as mild fatigue from his atrial fibrillation.  When he is not in atrial fibrillation he feels well and is without complaint.  He does have sleep apnea and has just started CPAP.  His blood pressure has been better controlled.  He would prefer ablation over medication management for his atrial fibrillation. ? ?Today, denies symptoms of palpitations, chest pain, shortness of breath, orthopnea, PND, lower extremity edema, claudication, dizziness, presyncope, syncope, bleeding, or neurologic sequela. The patient is tolerating medications without difficulties.   ? ? ?Past Medical History:  ?Diagnosis Date  ? Asthma   ? Cancer [facility])   ? prostate   ? GERD (gastroesophageal reflux disease)   ? Hypertension   ? Sleep apnea   ? ?Past Surgical History:  ?Procedure Laterality Date  ? prostate cryoblation    ? ? ? ?Current Outpatient Medications  ?Medication Sig Dispense Refill  ? albuterol (VENTOLIN HFA) 108 (90 Base) MCG/ACT inhaler Inhale 2 puffs into the lungs every 4 (four) hours as needed for shortness of breath.    ? apixaban (ELIQUIS) 5 MG TABS tablet Take 1 tablet (5 mg total) by mouth 2 (two) times daily. 180 tablet 2  ? azelastine (ASTELIN) 0.1 % nasal spray USE 2 SPRAYS IN EACH  NOSTRIL TWICE DAILY AS DIRECTED 90 mL 3  ? carvedilol (COREG) 12.5 MG tablet TAKE 1 TABLET(12.5 MG) BY MOUTH TWICE DAILY WITH A MEAL 180 tablet 2  ? diclofenac Sodium (VOLTAREN) 1 % GEL Apply 2 g topically as needed (sholduer pain).    ? diltiazem (CARDIZEM CD) 180 MG 24 hr capsule Take 1 capsule (180 mg total) by mouth daily. 90 capsule 1  ? diltiazem (CARDIZEM) 30 MG tablet Take 1 tablet every 4 hours AS NEEDED for heart rate >100 45 tablet 1  ? eszopiclone (LUNESTA) 1 MG TABS tablet Take 1 tablet (1 mg total) by mouth at bedtime as needed for sleep. Take immediately before bedtime 30 tablet 0  ? fluticasone (FLONASE) 50 MCG/ACT nasal spray Place 1 spray into both nostrils in the morning and at bedtime.    ? hydrochlorothiazide (HYDRODIURIL) 25 MG tablet Take 1 tablet (25 mg total) by mouth daily. 90 tablet 1  ? levocetirizine (XYZAL) 5 MG tablet Take 1 tablet (5 mg total) by mouth every evening.    ? losartan (COZAAR) 100 MG tablet Take 1 tablet (100 mg total) by mouth daily. 90 tablet 3  ? montelukast (SINGULAIR) 10 MG tablet Take 1 tablet (10 mg total) by mouth at bedtime. 30 tablet 3  ? omeprazole (PRILOSEC) 40 MG capsule TAKE 1 CAPSULE(40 MG) BY MOUTH DAILY 90 capsule 0  ? ?No current facility-administered medications for this visit.  ? ? ?Allergies:   Ace inhibitors  ? ?Social History:  The patient  reports that he has never smoked. He has never used smokeless tobacco. He reports current alcohol use of about 7.0 standard drinks per week. He reports that he does not use drugs.  ? ?Family History:  The patient's family history includes CAD in his maternal uncle; Cancer in his father and mother; Hypertension in his mother; Kidney cancer in his maternal grandmother.  ? ?ROS:  Please see the history of present illness.   Otherwise, review of systems is positive for none.   All other systems are reviewed and negative.  ? ?PHYSICAL EXAM: ?VS:  BP 116/68   Pulse 62   Ht 6' (1.829 m)   Wt 193 lb 6.4 oz (87.7 kg)    SpO2 98%   BMI 26.23 kg/m?  , BMI Body mass index is 26.23 kg/m?. ?GEN: Well nourished, well developed, in no acute distress  ?HEENT: normal  ?Neck: no JVD, carotid bruits, or masses ?Cardiac: RRR; no murmurs, rubs, or gallops,no edema  ?Respiratory:  clear to auscultation bilaterally, normal work of breathing ?GI: soft, nontender, nondistended, + BS ?MS: no deformity or atrophy  ?Skin: warm and dry ?Neuro:  Strength and sensation are intact ?Psych: euthymic mood, full affect ? ?EKG:  EKG is not ordered today. ?Personal review of the ekg ordered 09/24/21 shows sinus rhythm  ? ?Recent Labs: ?06/25/2021: ALT 25; Magnesium 2.5; TSH 3.342 ?09/21/2021: BUN 17; Creatinine, Ser 0.91; Hemoglobin 15.2; Platelets 249; Potassium 3.3; Sodium 135  ? ? ?Lipid Panel  ?   ?Component Value Date/Time  ? CHOL 186 06/25/2021 0409  ? TRIG 179 (H) 06/25/2021 0409  ? HDL 52 06/25/2021 0409  ? CHOLHDL 3.6 06/25/2021 0409  ? VLDL 36 06/25/2021 0409  ? [place] 98 06/25/2021 0409  ? ? ? ?Wt Readings from Last 3 Encounters:  ?11/07/21 193 lb 6.4 oz (87.7 kg)  ?10/30/21 191 lb 6.4 oz (86.8 kg)  ?09/21/21 190 lb (86.2 kg)  ?  ? ? ?Other studies Reviewed: ?Additional studies/ records that were reviewed today include: TTE 06/15/21  ?Review of the above records today demonstrates:  ? 1. Left ventricular ejection fraction by 3D volume is 67 %. The left  ?ventricle has normal function. The left ventricle has no regional wall  ?motion abnormalities. There is mild asymmetric left ventricular  ?hypertrophy of the posterior-lateral segment.  ?Left ventricular diastolic parameters are consistent with Grade I  ?diastolic dysfunction (impaired relaxation). The average left ventricular  ?global longitudinal strain is -23.3 %. The global longitudinal strain is  ?normal.  ? 2. Right ventricular systolic function is normal. The right ventricular  ?size is normal. Tricuspid regurgitation signal is inadequate for assessing  ?PA pressure.  ? 3. The mitral valve is  normal in structure. Trivial mitral valve  ?regurgitation. No evidence of mitral stenosis.  ? 4. The aortic valve is tricuspid. Aortic valve regurgitation is not  ?visualized. No aortic stenosis is present.  ? 5. The inferior vena cava is normal in size with greater than 50%  ?respiratory variability, suggesting right atrial pressure of 3 mmHg.  ? ?Cardiac monitor 08/23/2021 personally reviewed ?The basic rhythm is normal sinus with an average HR of 63 bpm ?Atrial fibrillation occurs with an overall burden of 4% ?No high-grade heart block or pathologic pauses, sinus bradycardia with ventricular rates as low as 37 bpm ?There are rare PVC's and rare supraventricular beats ? ?ASSESSMENT AND PLAN: ? ?1.  Paroxysmal atrial fibrillation: Currently on Eliquis 5 mg twice daily, diltiazem 180 mg  daily.  CHA2DS2-VASc of 1.  He is continued to have episodes of atrial fibrillation.  He would like to avoid antiarrhythmic medications.  Due to that, we Micheale Schlack plan for ablation. ? ?Risk, benefits, and alternatives to EP study and radiofrequency ablation for afib were also discussed in detail today. These risks include but are not limited to stroke, bleeding, vascular damage, tamponade, perforation, damage to the esophagus, lungs, and other structures, pulmonary vein stenosis, worsening renal function, and death. The patient understands these risk and wishes to proceed.  We Nedim Oki therefore proceed with catheter ablation at the next available time.  Carto, ICE, anesthesia are requested for the procedure.  Jaxton Casale also obtain CT PV protocol prior to the procedure to exclude LAA thrombus and further evaluate atrial anatomy. ? ?2.  Obstructive sleep apnea: CPAP compliance encouraged ? ?3.  Hypertension: Currently well controlled ? ? ?Current medicines are reviewed at length with the patient today.   ?The patient does not have concerns regarding his medicines.  The following changes were made today: None ? ?Labs/ tests ordered today include:   ?Orders Placed This Encounter  ?Procedures  ? CT CARDIAC MORPH/PULM VEIN W/CM&W/O CA SCORE  ? CBC w/Diff  ? Basic Metabolic Panel (BMET)  ? ? ? ?Disposition:   FU with Jaylise Peek 3 months ? ?Signed, ?Minie Roadcap Hassell Done

## 2021-11-19 NOTE — Telephone Encounter (Signed)
I tried calling this patient several weeks ago.  Arrange for an earlier evaluation in the office prior to his June date in mid April if possible. ?

## 2021-11-20 NOTE — Telephone Encounter (Signed)
April appointment made.  ? ?12/05/2021. ?

## 2021-11-21 ENCOUNTER — Encounter: Payer: Self-pay | Admitting: Cardiology

## 2021-11-28 ENCOUNTER — Encounter: Payer: Self-pay | Admitting: Family Medicine

## 2021-11-28 DIAGNOSIS — F5102 Adjustment insomnia: Secondary | ICD-10-CM

## 2021-11-29 MED ORDER — ESZOPICLONE 1 MG PO TABS: 1.0000 mg | ORAL_TABLET | Freq: Every evening | ORAL | 0 refills | Status: DC | PRN

## 2021-12-05 ENCOUNTER — Encounter: Payer: Self-pay | Admitting: Cardiovascular Disease

## 2021-12-05 ENCOUNTER — Ambulatory Visit: Payer: 59 | Admitting: Cardiovascular Disease

## 2021-12-05 DIAGNOSIS — G4733 Obstructive sleep apnea (adult) (pediatric): Secondary | ICD-10-CM

## 2021-12-05 DIAGNOSIS — I10 Essential (primary) hypertension: Secondary | ICD-10-CM | POA: Diagnosis not present

## 2021-12-05 DIAGNOSIS — I480 Paroxysmal atrial fibrillation: Secondary | ICD-10-CM | POA: Diagnosis not present

## 2021-12-05 DIAGNOSIS — Z7901 Long term (current) use of anticoagulants: Secondary | ICD-10-CM

## 2021-12-05 DIAGNOSIS — G4736 Sleep related hypoventilation in conditions classified elsewhere: Secondary | ICD-10-CM

## 2021-12-05 DIAGNOSIS — R002 Palpitations: Secondary | ICD-10-CM

## 2021-12-05 NOTE — Progress Notes (Addendum)
? ?Cardiology Office Note   ? ?Date:  12/09/2021  ? ?ID:  Clarence Perez, DOB 10-Oct-1960, MRN [id number] ? ?PCP:  Haydee Salter, MD  ?Cardiologist:  Shelva Majestic, MD (sleep); Dr. Curt Bears ? ?New sleep evaluation ? ? ?History of Present Illness:  ?Clarence Perez is a 61 y.o. male whi is followed by Dr Curt Bears who has PAF and is scheduled to undergo a potential cardiac ablation in June 2023.   He as a history of hypertension for 15 years, GERD, prostate cancer and OSA. He had initially undergone a sleep study in 2010 and had severe OSA with AHI ` 50/h. He apparently tried CPAP for a short duration. Since 2016-2017 he has a recurrent palpitations and due to concern for untreated OSA he underwent a home sleep study on 08/18/2021. Severe OSA was again demonstrated with AHI 36.3/h and he developed significant O2 desaturation to a nadir of 78%.  He had called the office and requested he speak to me, and I had a lengthy discussion with him on the phone and reviewed hid study and in light of his potential ablation tried to expedite his CPAP set up date. Shortly thereafter he received a new ResMed AirSense 11 AutoSet CPAP unit on 10/05/2021. Previous to CPAP use he was snoring, had nocturia 2 times per night and was having nocturnal palpitation. Since CPAP initiation symptoms have improved. A download from 2/17 - 11/03/2021 demonstrates compliance with usage essentially every night with average use of 6 hours and 20 minutes.  His CPAP unit is set at a pressure range of 7 to 18 cm of water.  His 95th percentile pressure is 12.6 and maximum average pressure 14.3 cm and AHI is 3.8.  Average sleep duration was 6 hours and 20 minutes.  Typically, he goes to bed around 10:20 p.m. and wakes up around 6:20 AM.  He denies any residual daytime sleepiness.  An Epworth Sleepiness Scale score was calculated in the office today and this endorsed at 7 arguing against residual sleepiness.  He had questions concerning his potential  tentatively scheduled atrial fibrillation ablation.  Presently, he is on carvedilol 12.5 mg twice a day, diltiazem CD1 180 mg daily, hydrochlorothiazide 25 mg in addition to losartan 100 mg daily for hypertension.  He is anticoagulated with Eliquis 5 mg twice a day.  He is on [place] which he takes as needed for sleep initiation and maintenance.  He is on omeprazole for GERD.  He takes albuterol on a as needed basis for mild asthma.  He presents for his initial office evaluation with me. ? ? ?Past Medical History:  ?Diagnosis Date  ? Asthma   ? Cancer [facility])   ? prostate   ? GERD (gastroesophageal reflux disease)   ? Hypertension   ? Sleep apnea   ? ? ?Past Surgical History:  ?Procedure Laterality Date  ? prostate cryoblation    ? ? ?Current Medications: ?Outpatient Medications Prior to Visit  ?Medication Sig Dispense Refill  ? albuterol (VENTOLIN HFA) 108 (90 Base) MCG/ACT inhaler Inhale 2 puffs into the lungs every 4 (four) hours as needed for shortness of breath.    ? apixaban (ELIQUIS) 5 MG TABS tablet Take 1 tablet (5 mg total) by mouth 2 (two) times daily. 180 tablet 2  ? azelastine (ASTELIN) 0.1 % nasal spray USE 2 SPRAYS IN EACH NOSTRIL TWICE DAILY AS DIRECTED 90 mL 3  ? carvedilol (COREG) 12.5 MG tablet TAKE 1 TABLET(12.5 MG) BY MOUTH TWICE DAILY WITH A MEAL  180 tablet 2  ? diclofenac Sodium (VOLTAREN) 1 % GEL Apply 2 g topically as needed (sholduer pain).    ? diltiazem (CARDIZEM CD) 180 MG 24 hr capsule Take 1 capsule (180 mg total) by mouth daily. 90 capsule 1  ? diltiazem (CARDIZEM) 30 MG tablet Take 1 tablet every 4 hours AS NEEDED for heart rate >100 45 tablet 1  ? eszopiclone (LUNESTA) 1 MG TABS tablet Take 1 tablet (1 mg total) by mouth at bedtime as needed for sleep. Take immediately before bedtime 30 tablet 0  ? fluticasone (FLONASE) 50 MCG/ACT nasal spray Place 1 spray into both nostrils in the morning and at bedtime.    ? hydrochlorothiazide (HYDRODIURIL) 25 MG tablet Take 1 tablet (25 mg total)  by mouth daily. 90 tablet 1  ? levocetirizine (XYZAL) 5 MG tablet Take 1 tablet (5 mg total) by mouth every evening.    ? losartan (COZAAR) 100 MG tablet Take 1 tablet (100 mg total) by mouth daily. 90 tablet 3  ? montelukast (SINGULAIR) 10 MG tablet Take 1 tablet (10 mg total) by mouth at bedtime. 30 tablet 3  ? omeprazole (PRILOSEC) 40 MG capsule TAKE 1 CAPSULE(40 MG) BY MOUTH DAILY 90 capsule 0  ? ?No facility-administered medications prior to visit.  ?  ? ?Allergies:   Ace inhibitors  ? ?Social History  ? ?Socioeconomic History  ? Marital status: Married  ?  Spouse name: Not on file  ? Number of children: Not on file  ? Years of education: Not on file  ? Highest education level: Not on file  ?Occupational History  ? Not on file  ?Tobacco Use  ? Smoking status: Never  ? Smokeless tobacco: Never  ?Vaping Use  ? Vaping Use: Never used  ?Substance and Sexual Activity  ? Alcohol use: Yes  ?  Alcohol/week: 7.0 standard drinks  ?  Types: 7 Cans of beer per week  ?  Comment: 1 beer daily 07/02/21  ? Drug use: Never  ? Sexual activity: Not on file  ?Other Topics Concern  ? Not on file  ?Social History Narrative  ? Not on file  ? ?Social Determinants of Health  ? ?Financial Resource Strain: Not on file  ?Food Insecurity: Not on file  ?Transportation Needs: Not on file  ?Physical Activity: Not on file  ?Stress: Not on file  ?Social Connections: Not on file  ? ?Socially he was born in [place], [place]. 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  ? ?Family History:  The patient's family history includes CAD in his maternal uncle; Cancer in his father and mother; Hypertension in his mother; Kidney cancer in his maternal grandmother.  ? ?ROS ?General: Negative; No fevers, chills, or night sweats;  ?HEENT: Negative; No changes in vision or hearing, sinus congestion, difficulty swallowing ?Pulmonary: Negative; No  cough, wheezing, shortness of breath, hemoptysis ?Cardiovascular: History of hypertension, PAF, ?GI: GERD ?GU: Negative; No dysuria, hematuria, or difficulty voiding ?Musculoskeletal: Negative; no myalgias, joint pain, or weakness ?Hematologic/Oncology:  ?Endocrine: Negative; no heat/cold intolerance; no diabetes ?Neuro: Negative; no changes in balance, headaches ?Skin: Negative; No rashes or skin lesions ?Psychiatric: Negative; No behavioral problems, depression ?Sleep: See HPI: Initially diagnosed with OSA in 2010.  Most recently rediagnosed December 2022.  Since initiating CPAP therapy improvement in snoring, previous nocturia, and nocturnal palpitations.  No bruxism, restless legs, hypnogognic hallucinations, no  cataplexy ?Other comprehensive 14 point system review is negative. ? ? ?PHYSICAL EXAM:   ?VS:  BP 140/80   Pulse 66   Ht 6' (1.829 m)   Wt 196 lb 6.4 oz (89.1 kg)   SpO2 99%   BMI 26.64 kg/m?    ? ?Repeat blood pressure by me 124/80 ? ?Wt Readings from Last 3 Encounters:  ?12/05/21 196 lb 6.4 oz (89.1 kg)  ?11/07/21 193 lb 6.4 oz (87.7 kg)  ?10/30/21 191 lb 6.4 oz (86.8 kg)  ?  ?General: Alert, oriented, no distress.  ?Skin: normal turgor, no rashes, warm and dry ?HEENT: Normocephalic, atraumatic. Pupils equal round and reactive to light; sclera anicteric; extraocular muscles intact;  ?Nose without nasal septal hypertrophy ?Mouth/Parynx: Elongated uvula; Mallinpatti scale 3/4,  ?Neck: No JVD, no carotid bruits; normal carotid upstroke ?Lungs: clear to ausculatation and percussion; no wheezing or rales ?Chest wall: without tenderness to palpitation ?Heart: PMI not displaced, RRR, s1 s2 normal, 1/6 systolic murmur, no diastolic murmur, no rubs, gallops, thrills, or heaves ?Abdomen: soft, nontender; no hepatosplenomehaly, BS+; abdominal aorta nontender and not dilated by palpation. ?Back: no CVA tenderness ?Pulses 2+ ?Musculoskeletal: full range of motion, normal strength, no joint  deformities ?Extremities: no clubbing cyanosis or edema, Homan's sign negative  ?Neurologic: grossly nonfocal; Cranial nerves grossly wnl ?Psychologic: Normal mood and affect ? ? ?Studies/Labs Reviewed:  ? ?ECG (independently read by me

## 2021-12-05 NOTE — Patient Instructions (Signed)
Medication Instructions:  ?The current medical regimen is effective;  continue present plan and medications as directed. Please refer to the Current Medication list given to you today. ? ?*If you need a refill on your cardiac medications before your next appointment, please call your pharmacy* ? ?Lab Work:   Testing/Procedures:  ?NONE    NONE ? ?Follow-Up: ?Your next appointment:  12 month(s) In Person with DR Claiborne Billings FOR SLEEP APPT  ? ?Please call our office 2 months in advance to schedule this appointment  ? ?At [facility], you and your health needs are our priority.  As part of our continuing mission to provide you with exceptional heart care, we have created designated Provider Care Teams.  These Care Teams include your primary Cardiologist (physician) and Advanced Practice Providers (APPs -  Physician Assistants and Nurse Practitioners) who all work together to provide you with the care you need, when you need it. ? ? ? ?Important Information About Sugar ? ? ? ? ? ? ? ?  ? ?

## 2021-12-11 ENCOUNTER — Ambulatory Visit: Payer: 59 | Admitting: Family Medicine

## 2021-12-11 ENCOUNTER — Encounter: Payer: Self-pay | Admitting: Family Medicine

## 2021-12-11 VITALS — BP 146/80 | HR 65 | Temp 98.3°F | Ht 72.0 in | Wt 194.2 lb

## 2021-12-11 DIAGNOSIS — I10 Essential (primary) hypertension: Secondary | ICD-10-CM | POA: Diagnosis not present

## 2021-12-11 DIAGNOSIS — G4733 Obstructive sleep apnea (adult) (pediatric): Secondary | ICD-10-CM

## 2021-12-11 DIAGNOSIS — J3089 Other allergic rhinitis: Secondary | ICD-10-CM

## 2021-12-11 DIAGNOSIS — I4891 Unspecified atrial fibrillation: Secondary | ICD-10-CM

## 2021-12-11 DIAGNOSIS — J4520 Mild intermittent asthma, uncomplicated: Secondary | ICD-10-CM

## 2021-12-11 MED ORDER — ALBUTEROL SULFATE HFA 108 (90 BASE) MCG/ACT IN AERS: 2.0000 | INHALATION_SPRAY | RESPIRATORY_TRACT | 1 refills | Status: AC | PRN

## 2021-12-11 NOTE — Progress Notes (Signed)
?[facility] ?[place] ?[place] [address] ?Dept: [phone] ?Dept Fax: [phone] ? ?Transfer of Care Office Visit ? ?Subjective:  ? ? Patient ID: Clarence Perez, male    DOB: 19-Dec-1960, 61 y.o..   MRN: [id number] ? ?Chief Complaint  ?Patient presents with  ? Establish Care  ?  TOC.    ? ? ?History of Present Illness: ? ?Patient is in today to establish care. 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 He did his residency in [place] and later returned back to [place] to work. 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 He has been married for 17 years. He has no children. Dr. Vilinda Flake does not use tobacco. He drinsk 3-4 drinks a week. He denies drug use. ? ?Dr. Vilinda Flake has a history fo atrial fibrillation. He is managed on diltiazem for rate control (180 mg daily and has an additional 30 mg dose he can take for rates > 100). He is on Eliquis for anticoagulation. He also has a history of some mild diastolic dysfunction. He notes there is a plan for a cardiac ablation in June. ? ?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 He is currently on carvedilol 12.5 mg bid, diltiazem 180 mg daily, HCTZ 25 mg daily (though he had recently been taking this qod), and losartan 100 mg daily. He notes that his pressures are often normal at rest, but with stress run mildly high. He admits that he has some significant stress during the day. ? ?Dr. Vilinda Flake has a history of allergic rhinitis. He is manages on levocetirizine, azelastine nasal spray, Flonase, and Singulair. He also has some mild, intermittent asthma for which he uses an albuterol inhaler. He only uses this every few months. ? ?Dr. Vilinda Flake has OSA. He now has a CPAP, but notes the adjustment to using this has been pretty difficult. He continues to have some ongoing insomnia  issues, both with difficulty initiating and maintaining sleep.  He is currently using some occasional eszopiclone (Lunesta). ? ?Past Medical History: ?Patient Active Problem List  ? Diagnosis Date Noted  ? Mild intermittent asthma 12/11/2021  ? Adjustment insomnia 10/30/2021  ? Allergic rhinitis 09/29/2021  ? Atrial fibrillation with rapid ventricular response ([place]) 06/25/2021  ? Grade I diastolic dysfunction 62/37/6283  ? Hyperglycemia   ? Essential hypertension 11/04/2018  ? Hyperlipidemia 11/04/2018  ? Gastro-esophageal reflux disease with esophagitis 12/04/2012  ? History of prostate cancer 06/02/2012  ? Obstructive sleep apnea 09/20/2011  ? ?Past Surgical History:  ?Procedure Laterality Date  ? prostate cryoblation    ? ?Family History  ?Problem Relation Age of Onset  ? Cancer Mother   ?     colon  ? Hypertension Mother   ? Cancer Father   ?     prostate and kidney  ? CAD Maternal Uncle   ? Heart disease Paternal Uncle   ? Cancer Maternal Grandmother   ?     Kidney  ? ?Outpatient Medications Prior to Visit  ?Medication Sig Dispense Refill  ? apixaban (ELIQUIS) 5 MG TABS tablet Take 1 tablet (5 mg total) by mouth 2 (two) times daily. 180 tablet 2  ? azelastine (ASTELIN) 0.1 % nasal spray USE 2 SPRAYS IN EACH NOSTRIL TWICE DAILY AS DIRECTED 90 mL 3  ? carvedilol (COREG) 12.5 MG tablet TAKE 1 TABLET(12.5 MG) BY MOUTH TWICE DAILY  WITH A MEAL 180 tablet 2  ? diclofenac Sodium (VOLTAREN) 1 % GEL Apply 2 g topically as needed (sholduer pain).    ? diltiazem (CARDIZEM CD) 180 MG 24 hr capsule Take 1 capsule (180 mg total) by mouth daily. 90 capsule 1  ? diltiazem (CARDIZEM) 30 MG tablet Take 1 tablet every 4 hours AS NEEDED for heart rate >100 45 tablet 1  ? eszopiclone (LUNESTA) 1 MG TABS tablet Take 1 tablet (1 mg total) by mouth at bedtime as needed for sleep. Take immediately before bedtime 30 tablet 0  ? fluticasone (FLONASE) 50 MCG/ACT nasal spray Place 1 spray into both nostrils in the morning and at bedtime.     ? hydrochlorothiazide (HYDRODIURIL) 25 MG tablet Take 1 tablet (25 mg total) by mouth daily. 90 tablet 1  ? levocetirizine (XYZAL) 5 MG tablet Take 1 tablet (5 mg total) by mouth every evening.    ? losartan (COZAAR) 100 MG tablet Take 1 tablet (100 mg total) by mouth daily. 90 tablet 3  ? montelukast (SINGULAIR) 10 MG tablet Take 1 tablet (10 mg total) by mouth at bedtime. 30 tablet 3  ? omeprazole (PRILOSEC) 40 MG capsule TAKE 1 CAPSULE(40 MG) BY MOUTH DAILY 90 capsule 0  ? albuterol (VENTOLIN HFA) 108 (90 Base) MCG/ACT inhaler Inhale 2 puffs into the lungs every 4 (four) hours as needed for shortness of breath.    ? ?No facility-administered medications prior to visit.  ? ?Allergies  ?Allergen Reactions  ? Ace Inhibitors Cough  ?  ?Objective:  ? ?Today's Vitals  ? 12/11/21 1606  ?BP: (!) 146/80  ?Pulse: 65  ?Temp: 98.3 ?F (36.8 ?C)  ?TempSrc: Temporal  ?SpO2: 98%  ?Weight: 194 lb 3.2 oz (88.1 kg)  ?Height: 6' (1.829 m)  ? ?Body mass index is 26.34 kg/m?.  ? ?General: Well developed, well nourished. No acute distress. ?Psych: Alert and oriented. Normal mood and affect. ? ?Health Maintenance Due  ?Topic Date Due  ? HIV Screening  Never done  ? Hepatitis C Screening  Never done  ? Zoster Vaccines- Shingrix (1 of 2) Never done  ? TETANUS/TDAP  07/21/2019  ?   ?Assessment & Plan:  ? ?1. Essential hypertension ?Dr. Harle Stanford' blood pressure is still a bit high. He admits to having decreased his HCTZ to every other day. We talked about changing this to chlorthalidone, but he has had a low potassium earlier this year. He prefers not to have to take an additional med. We agreed to giving him a trial of taking his HCTZ consistently. If not improved in 3 months, we will readdress his blood pressure meds. ? ?2. Atrial fibrillation with rapid ventricular response ([place]) ?Stable currently. Continue diltiazem 180 mg daily for rate control. and Eliquis 5 mg daily for anticoagulation. Ablation planned for June. ? ?3. Non-seasonal  allergic rhinitis due to other allergic trigger ?Stable on current regimen. ? ?4. Obstructive sleep apnea ?He will continue to work with his CPAP to see if he can tolerate this. ? ?5. Mild intermittent asthma without complication ?I will renew Dr. Vilinda Flake' albuterol. ? ?- albuterol (VENTOLIN HFA) 108 (90 Base) MCG/ACT inhaler; Inhale 2 puffs into the lungs every 4 (four) hours as needed for shortness of breath.  Dispense: 8 g; Refill: 1 ? ? ?Return in about 3 months (around 03/12/2022) for Reassessment.  ? ?Haydee Salter, MD ?

## 2021-12-15 ENCOUNTER — Other Ambulatory Visit: Payer: Self-pay | Admitting: Nurse Practitioner

## 2021-12-25 ENCOUNTER — Ambulatory Visit: Payer: 59 | Admitting: Cardiology

## 2022-01-03 ENCOUNTER — Other Ambulatory Visit (HOSPITAL_COMMUNITY): Payer: Self-pay | Admitting: Physician Assistant

## 2022-01-08 ENCOUNTER — Other Ambulatory Visit: Payer: Self-pay | Admitting: Family Medicine

## 2022-01-08 DIAGNOSIS — F5102 Adjustment insomnia: Secondary | ICD-10-CM

## 2022-01-09 ENCOUNTER — Ambulatory Visit (HOSPITAL_COMMUNITY): Payer: 59 | Admitting: Nurse Practitioner

## 2022-01-09 ENCOUNTER — Encounter (HOSPITAL_COMMUNITY): Payer: Self-pay

## 2022-01-09 MED ORDER — ESZOPICLONE 1 MG PO TABS: 1.0000 mg | ORAL_TABLET | Freq: Every evening | ORAL | 2 refills | Status: DC | PRN

## 2022-01-16 ENCOUNTER — Telehealth: Payer: Self-pay | Admitting: *Deleted

## 2022-01-16 ENCOUNTER — Other Ambulatory Visit: Payer: 59

## 2022-01-16 NOTE — Telephone Encounter (Signed)
Pt called to AFib clinic, unaware of how to reach me directly -- called to cancel Ablation scheduled for 6/21. He going through [facility] for procedure. He will keep AFib clinic appt in July and determine then if going to see Dr. Curt Bears again. Aware I will update MD.

## 2022-01-23 ENCOUNTER — Other Ambulatory Visit (HOSPITAL_COMMUNITY): Payer: Self-pay | Admitting: Physician Assistant

## 2022-01-23 ENCOUNTER — Other Ambulatory Visit: Payer: Self-pay | Admitting: Nurse Practitioner

## 2022-01-23 DIAGNOSIS — I10 Essential (primary) hypertension: Secondary | ICD-10-CM

## 2022-01-23 NOTE — Telephone Encounter (Signed)
Chart Suports Rx Last OV: 11/2021 Next OV: 02/2022

## 2022-01-25 ENCOUNTER — Other Ambulatory Visit: Payer: Self-pay | Admitting: Nurse Practitioner

## 2022-01-25 ENCOUNTER — Other Ambulatory Visit (HOSPITAL_COMMUNITY): Payer: Self-pay | Admitting: Physician Assistant

## 2022-01-25 DIAGNOSIS — I10 Essential (primary) hypertension: Secondary | ICD-10-CM

## 2022-01-30 ENCOUNTER — Ambulatory Visit (HOSPITAL_COMMUNITY): Payer: 59

## 2022-02-06 ENCOUNTER — Ambulatory Visit (HOSPITAL_COMMUNITY): Admit: 2022-02-06 | Payer: 59 | Admitting: Cardiology

## 2022-02-06 ENCOUNTER — Encounter (HOSPITAL_COMMUNITY): Payer: Self-pay

## 2022-02-06 SURGERY — ATRIAL FIBRILLATION ABLATION
Anesthesia: General

## 2022-02-07 ENCOUNTER — Other Ambulatory Visit (HOSPITAL_COMMUNITY): Payer: Self-pay | Admitting: Physician Assistant

## 2022-02-07 DIAGNOSIS — I10 Essential (primary) hypertension: Secondary | ICD-10-CM

## 2022-03-06 ENCOUNTER — Ambulatory Visit (HOSPITAL_COMMUNITY): Payer: 59 | Admitting: Physician Assistant

## 2022-03-12 ENCOUNTER — Encounter: Payer: Self-pay | Admitting: Family Medicine

## 2022-03-12 ENCOUNTER — Ambulatory Visit (INDEPENDENT_AMBULATORY_CARE_PROVIDER_SITE_OTHER): Payer: 59 | Admitting: Family Medicine

## 2022-03-12 VITALS — BP 154/86 | HR 89 | Temp 97.9°F | Ht 72.0 in | Wt 193.4 lb

## 2022-03-12 DIAGNOSIS — I4891 Unspecified atrial fibrillation: Secondary | ICD-10-CM

## 2022-03-12 DIAGNOSIS — I10 Essential (primary) hypertension: Secondary | ICD-10-CM | POA: Diagnosis not present

## 2022-03-12 DIAGNOSIS — E876 Hypokalemia: Secondary | ICD-10-CM

## 2022-03-12 DIAGNOSIS — Z23 Encounter for immunization: Secondary | ICD-10-CM

## 2022-03-12 MED ORDER — CHLORTHALIDONE 25 MG PO TABS: 25.0000 mg | ORAL_TABLET | Freq: Every day | ORAL | 3 refills | Status: DC

## 2022-03-12 MED ORDER — POTASSIUM CHLORIDE CRYS ER 20 MEQ PO TBCR: 20.0000 meq | EXTENDED_RELEASE_TABLET | Freq: Every day | ORAL | 3 refills | Status: DC

## 2022-03-12 NOTE — Progress Notes (Signed)
[facility] [address] [address] Dept: [phone] Dept Fax: [phone]  Chronic Care Office Visit  Subjective:    Patient ID: Clarence Perez, male    DOB: 13-Dec-1960, 61 y.o..   MRN: [id number]  Chief Complaint  Patient presents with   Follow-up    3 month f/u. No concerns.       History of Present Illness:  Patient is in today for reassessment of chronic medical issues.  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 He underwent EP ablation on 02/20/2022. He notes the ablation seems to have been successful. His diltiazem was stopped. He remains on Eliquis for anticoagulation.    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 He is currently on carvedilol 12.5 mg bid, HCTZ 25 mg daily, and losartan 100 mg daily. His diltiazem was recently stopped. He would prefer not to start on an additional medicine, but consider a switch to a more potent diuretic.  Past Medical History: Patient Active Problem List   Diagnosis Date Noted   Mild intermittent asthma 12/11/2021   Adjustment insomnia 10/30/2021   Allergic rhinitis 09/29/2021   Atrial fibrillation with rapid ventricular response ([place]) 06/25/2021   Grade I diastolic dysfunction 62/94/7654   Hyperglycemia    Essential hypertension 11/04/2018   Hyperlipidemia 11/04/2018   Gastro-esophageal reflux disease with esophagitis 12/04/2012   History of prostate cancer 06/02/2012   Obstructive sleep apnea 09/20/2011   Past Surgical History:  Procedure Laterality Date   prostate cryoblation     Family History  Problem Relation Age of Onset   Cancer Mother        colon   Hypertension Mother    Cancer Father        prostate and kidney   CAD Maternal Uncle    Heart disease Paternal Uncle    Cancer Maternal Grandmother        Kidney   Outpatient Medications Prior to Visit  Medication Sig Dispense Refill   albuterol (VENTOLIN HFA) 108 (90 Base) MCG/ACT  inhaler Inhale 2 puffs into the lungs every 4 (four) hours as needed for shortness of breath. 8 g 1   azelastine (ASTELIN) 0.1 % nasal spray USE 2 SPRAYS IN EACH NOSTRIL TWICE DAILY AS DIRECTED 90 mL 3   carvedilol (COREG) 12.5 MG tablet TAKE 1 TABLET BY MOUTH TWICE  DAILY WITH A MEAL 180 tablet 3   colchicine 0.6 MG tablet Take 0.6 mg by mouth daily.     diclofenac Sodium (VOLTAREN) 1 % GEL Apply 2 g topically as needed (sholduer pain).     diphenhydrAMINE (BENADRYL) 25 mg capsule Take by mouth.     ELIQUIS 5 MG TABS tablet TAKE 1 TABLET BY MOUTH TWICE  DAILY 180 tablet 3   EPINEPHrine 0.3 mg/0.3 mL IJ SOAJ injection Inject into the muscle.     eszopiclone (LUNESTA) 1 MG TABS tablet Take 1 tablet (1 mg total) by mouth at bedtime as needed for sleep. Take immediately before bedtime 30 tablet 2   fluticasone (FLONASE) 50 MCG/ACT nasal spray Place 1 spray into both nostrils in the morning and at bedtime.     levocetirizine (XYZAL) 5 MG tablet Take 1 tablet (5 mg total) by mouth every evening.     losartan (COZAAR) 100 MG tablet Take 1 tablet (100 mg total) by mouth daily. 90 tablet 3   montelukast (SINGULAIR) 10 MG tablet Take 1 tablet (10 mg total) by mouth at bedtime. 30 tablet  3   omeprazole (PRILOSEC) 40 MG capsule TAKE 1 CAPSULE BY MOUTH DAILY 90 capsule 3   omeprazole (PRILOSEC) 40 MG capsule Take by mouth.     oxymetazoline (NASAL RELIEF) 0.05 % nasal spray Place into the nose.     sucralfate (CARAFATE) 1 g tablet Take 1 g by mouth 4 (four) times daily.     White Petrolatum-Mineral Oil (ARTIFICIAL TEARS) OINT ophthalmic ointment Apply to eye.     hydrochlorothiazide (HYDRODIURIL) 25 MG tablet TAKE 1 TABLET BY MOUTH DAILY 90 tablet 3   diltiazem (CARDIZEM CD) 180 MG 24 hr capsule TAKE 1 CAPSULE BY MOUTH DAILY 90 capsule 3   diltiazem (CARDIZEM) 30 MG tablet Take 1 tablet every 4 hours AS NEEDED for heart rate >100 45 tablet 1   No facility-administered medications prior to visit.    Allergies  Allergen Reactions   Ace Inhibitors Cough    Objective:   Today's Vitals   03/12/22 1305  BP: (!) 154/86  Pulse: 89  Temp: 97.9 F (36.6 C)  TempSrc: Temporal  SpO2: 93%  Weight: 193 lb 6.4 oz (87.7 kg)  Height: 6' (1.829 m)   Body mass index is 26.23 kg/m.   General: Well developed, well nourished. No acute distress. CV: RRR without murmurs or rubs. Pulses 2+ bilaterally. Psych: Alert and oriented. Normal mood and affect.  Health Maintenance Due  Topic Date Due   HIV Screening  Never done   Hepatitis C Screening  Never done   Zoster Vaccines- Shingrix (1 of 2) Never done   TETANUS/TDAP  07/21/2019   Lab Results    Latest Ref Rng & Units 09/21/2021   10:03 PM 08/01/2021    2:00 PM 06/25/2021    4:09 AM  BMP  Glucose 70 - 99 mg/dL 109  97  123   BUN 8 - 23 mg/dL '17  12  18   '$ Creatinine 0.61 - 1.24 mg/dL 0.91  0.94  0.80   Sodium 135 - 145 mmol/L 135  137  139   Potassium 3.5 - 5.1 mmol/L 3.3  3.8  3.9   Chloride 98 - 111 mmol/L 101  103  104   CO2 22 - 32 mmol/L '27  27  26   '$ Calcium 8.9 - 10.3 mg/dL 8.9  9.5  9.9      Assessment & Plan:   1. Essential hypertension Dr. Vilinda Flake will continue on carvedilol 12.5 mg bid and losartan 100 mg daily. I will switch his HCTZ to chlorthalidone. I recommend we reassess his blood pressure in 6 weeks.  - chlorthalidone (HYGROTON) 25 MG tablet; Take 1 tablet (25 mg total) by mouth daily.  Dispense: 90 tablet; Refill: 3  2. Atrial fibrillation with rapid ventricular response (HCC) Appears to be doing much better s/p ablation. Continue apixiban 5 mg bid.  3. Hypokalemia In light of prior hypokalemia and the increased tendency of chlorthalidone to cause hypokalemia, I will start Dr. Vilinda Flake on a potassium supplement. Plan repeat potassium level at his next appointment.  - potassium chloride SA (KLOR-CON M) 20 MEQ tablet; Take 1 tablet (20 mEq total) by mouth daily.  Dispense: 90 tablet; Refill: 3   Return in  about 6 weeks (around 04/23/2022) for Reassessment.   Haydee Salter, MD

## 2022-03-21 ENCOUNTER — Other Ambulatory Visit: Payer: Self-pay | Admitting: Family Medicine

## 2022-03-22 MED ORDER — DICLOFENAC SODIUM 1 % EX GEL: 2.0000 g | CUTANEOUS | 1 refills | Status: AC | PRN

## 2022-03-27 ENCOUNTER — Telehealth: Payer: Self-pay | Admitting: Family Medicine

## 2022-03-27 ENCOUNTER — Telehealth: Payer: Self-pay

## 2022-03-27 NOTE — Telephone Encounter (Signed)
Caller Name: Misty Rago Call back phone #: [phone]  Reason for Call: Pt was returning call from [facility], asked if you would please leave him a message through [facility] about what you were attempting to speak with him earlier.

## 2022-03-27 NOTE — Telephone Encounter (Signed)
Lft VM to rtn call regarding Voltaren Gel.  Insurance wont cover the medication.  Dm/cma

## 2022-04-18 ENCOUNTER — Telehealth: Payer: Self-pay | Admitting: Family Medicine

## 2022-04-18 DIAGNOSIS — I10 Essential (primary) hypertension: Secondary | ICD-10-CM

## 2022-04-18 MED ORDER — CARVEDILOL 12.5 MG PO TABS: ORAL_TABLET | ORAL | 0 refills | Status: DC

## 2022-04-18 NOTE — Telephone Encounter (Signed)
Rx sent to the [facility] per patient request   my chart message sent to inform of this.  Dm/cma

## 2022-04-18 NOTE — Telephone Encounter (Signed)
Pt has his carvedilol (COREG) 12.5 MG tablet [[id number]] coming through the mail service [facility]. He took his last pill last night, he would like to know if he could have a script sent to [facility] pharmacy to last him until he gets his delivery. Baldo Ash filled his last script

## 2022-04-24 ENCOUNTER — Ambulatory Visit: Payer: 59 | Admitting: Family Medicine

## 2022-05-01 ENCOUNTER — Other Ambulatory Visit: Payer: Self-pay | Admitting: Nurse Practitioner

## 2022-05-01 DIAGNOSIS — I10 Essential (primary) hypertension: Secondary | ICD-10-CM

## 2022-05-02 NOTE — Telephone Encounter (Signed)
Chart supports Rx Last OV: 02/2022 Next OV: 04/2022

## 2022-05-07 ENCOUNTER — Ambulatory Visit: Payer: 59 | Admitting: Cardiology

## 2022-05-08 ENCOUNTER — Ambulatory Visit (INDEPENDENT_AMBULATORY_CARE_PROVIDER_SITE_OTHER): Payer: 59 | Admitting: Family Medicine

## 2022-05-08 ENCOUNTER — Encounter: Payer: Self-pay | Admitting: Family Medicine

## 2022-05-08 VITALS — BP 122/80 | HR 82 | Temp 97.9°F | Ht 72.0 in | Wt 190.2 lb

## 2022-05-08 DIAGNOSIS — I10 Essential (primary) hypertension: Secondary | ICD-10-CM | POA: Diagnosis not present

## 2022-05-08 DIAGNOSIS — I4891 Unspecified atrial fibrillation: Secondary | ICD-10-CM

## 2022-05-08 DIAGNOSIS — Z23 Encounter for immunization: Secondary | ICD-10-CM | POA: Diagnosis not present

## 2022-05-08 DIAGNOSIS — E876 Hypokalemia: Secondary | ICD-10-CM

## 2022-05-08 NOTE — Progress Notes (Signed)
[facility] [address] [place] [address] Dept: [phone] Dept Fax: [phone]  Chronic Care Office Visit  Subjective:    Patient ID: Clarence Perez, male    DOB: October 18, 1960, 61 y.o..   MRN: [id number]  Chief Complaint  Patient presents with   Follow-up    6 week f/u. No concerns.  Wants flu shot today.     History of Present Illness:  Patient is in today for reassessment of chronic medical issues.  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 He underwent EP ablation on 02/20/2022. He notes the ablation seems to have been successful. His diltiazem was stopped. He remains on Eliquis for anticoagulation. He will follow up with his cardiologist later today.   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 He is currently on carvedilol 12.5 mg bid, losartan 100 mg daily, and at his last visit, we switched him from HCTZ to chlorthalidone. He did have a mildly low potassium, so we added a potassium supplement. He is tolerating these all well.  Past Medical History: Patient Active Problem List   Diagnosis Date Noted   Mild intermittent asthma 12/11/2021   Adjustment insomnia 10/30/2021   Allergic rhinitis 09/29/2021   Atrial fibrillation with rapid ventricular response ([place]) 06/25/2021   Grade I diastolic dysfunction 62/94/7654   Hyperglycemia    Essential hypertension 11/04/2018   Hyperlipidemia 11/04/2018   Gastro-esophageal reflux disease with esophagitis 12/04/2012   History of prostate cancer 06/02/2012   Obstructive sleep apnea 09/20/2011   Past Surgical History:  Procedure Laterality Date   prostate cryoblation     Family History  Problem Relation Age of Onset   Cancer Mother        colon   Hypertension Mother    Cancer Father        prostate and kidney   CAD Maternal Uncle    Heart disease Paternal Uncle    Cancer Maternal Grandmother        Kidney    Outpatient Medications Prior to Visit   Medication Sig Dispense Refill   albuterol (VENTOLIN HFA) 108 (90 Base) MCG/ACT inhaler Inhale 2 puffs into the lungs every 4 (four) hours as needed for shortness of breath. 8 g 1   azelastine (ASTELIN) 0.1 % nasal spray USE 2 SPRAYS IN EACH NOSTRIL TWICE DAILY AS DIRECTED 90 mL 3   carvedilol (COREG) 12.5 MG tablet TAKE 1 TABLET BY MOUTH TWICE  DAILY WITH A MEAL 60 tablet 0   chlorthalidone (HYGROTON) 25 MG tablet Take 1 tablet (25 mg total) by mouth daily. 90 tablet 3   diclofenac Sodium (VOLTAREN) 1 % GEL Apply 2 g topically as needed (sholduer pain). 300 g 1   diphenhydrAMINE (BENADRYL) 25 mg capsule Take by mouth.     ELIQUIS 5 MG TABS tablet TAKE 1 TABLET BY MOUTH TWICE  DAILY 180 tablet 3   EPINEPHrine 0.3 mg/0.3 mL IJ SOAJ injection Inject into the muscle.     eszopiclone (LUNESTA) 1 MG TABS tablet Take 1 tablet (1 mg total) by mouth at bedtime as needed for sleep. Take immediately before bedtime 30 tablet 2   fluticasone (FLONASE) 50 MCG/ACT nasal spray Place 1 spray into both nostrils in the morning and at bedtime.     levocetirizine (XYZAL) 5 MG tablet Take 1 tablet (5 mg total) by mouth every evening.     losartan (COZAAR) 100 MG tablet TAKE 1 TABLET BY MOUTH DAILY 90 tablet 3  montelukast (SINGULAIR) 10 MG tablet Take 1 tablet (10 mg total) by mouth at bedtime. 30 tablet 3   omeprazole (PRILOSEC) 40 MG capsule TAKE 1 CAPSULE BY MOUTH DAILY 90 capsule 3   omeprazole (PRILOSEC) 40 MG capsule Take by mouth.     oxymetazoline (NASAL RELIEF) 0.05 % nasal spray Place into the nose.     potassium chloride SA (KLOR-CON M) 20 MEQ tablet Take 1 tablet (20 mEq total) by mouth daily. 90 tablet 3   White Petrolatum-Mineral Oil (ARTIFICIAL TEARS) OINT ophthalmic ointment Apply to eye.     colchicine 0.6 MG tablet Take 0.6 mg by mouth daily.     sucralfate (CARAFATE) 1 g tablet Take 1 g by mouth 4 (four) times daily.     No facility-administered medications prior to visit.   Allergies   Allergen Reactions   Ace Inhibitors Cough    Objective:   Today's Vitals   05/08/22 1317  BP: 122/80  Pulse: 82  Temp: 97.9 F (36.6 C)  TempSrc: Temporal  SpO2: 99%  Weight: 190 lb 3.2 oz (86.3 kg)  Height: 6' (1.829 m)   Body mass index is 25.8 kg/m.   General: Well developed, well nourished. No acute distress. Psych: Alert and oriented. Normal mood and affect.  Health Maintenance Due  Topic Date Due   HIV Screening  Never done   Hepatitis C Screening  Never done   TETANUS/TDAP  07/21/2019   INFLUENZA VACCINE  03/19/2022   Zoster Vaccines- Shingrix (2 of 2) 05/07/2022     Assessment & Plan:   1. Essential hypertension Dr. Vilinda Flake' blood pressure is improved on the current combination of carvedilol 12.5 mg bid, losartan 100 mg daily, and chlorthalidone 25 mg daily. I will recheck his BMP today to reassess his potassium.  - Basic metabolic panel  2. Atrial fibrillation with rapid ventricular response (HCC) Stable s/p ablation. Follow up with cardiology later today.  3. Hypokalemia As above, will reassess potassium level. Plan to continue potassium 20 meq daily.  - Basic metabolic panel  4. Need for influenza vaccination  - Flu Vaccine QUAD 6+ mos PF IM (Fluarix Quad PF)   Return in about 3 months (around 08/07/2022) for Annual preventative care.   Haydee Salter, MD

## 2022-05-09 ENCOUNTER — Encounter: Payer: Self-pay | Admitting: Family Medicine

## 2022-05-09 DIAGNOSIS — E876 Hypokalemia: Secondary | ICD-10-CM

## 2022-05-09 LAB — BASIC METABOLIC PANEL
BUN: 19 mg/dL (ref 6–23)
CO2: 32 mEq/L (ref 19–32)
Calcium: 9.6 mg/dL (ref 8.4–10.5)
Chloride: 97 mEq/L (ref 96–112)
Creatinine, Ser: 0.99 mg/dL (ref 0.40–1.50)
GFR: 82.02 mL/min (ref >60.00)
Glucose, Bld: 89 mg/dL (ref 70–99)
Potassium: 3.3 mEq/L — ABNORMAL LOW (ref 3.5–5.1)
Sodium: 139 mEq/L (ref 135–145)

## 2022-05-10 MED ORDER — POTASSIUM CHLORIDE CRYS ER 20 MEQ PO TBCR: 40.0000 meq | EXTENDED_RELEASE_TABLET | Freq: Every day | ORAL | 3 refills | Status: DC

## 2022-05-20 ENCOUNTER — Encounter: Payer: Self-pay | Admitting: Family Medicine

## 2022-05-22 ENCOUNTER — Emergency Department (HOSPITAL_COMMUNITY)
Admission: EM | Admit: 2022-05-22 | Discharge: 2022-05-23 | Disposition: A | Discharge status: Home / Self Care | Payer: 59 | Attending: Emergency Medicine | Admitting: Emergency Medicine

## 2022-05-22 ENCOUNTER — Encounter: Payer: Self-pay | Admitting: Family Medicine

## 2022-05-22 ENCOUNTER — Ambulatory Visit: Payer: 59 | Admitting: Family Medicine

## 2022-05-22 VITALS — BP 136/80 | HR 86 | Temp 97.8°F | Ht 72.0 in | Wt 189.6 lb

## 2022-05-22 DIAGNOSIS — Z8546 Personal history of malignant neoplasm of prostate: Secondary | ICD-10-CM | POA: Insufficient documentation

## 2022-05-22 DIAGNOSIS — K449 Diaphragmatic hernia without obstruction or gangrene: Secondary | ICD-10-CM | POA: Insufficient documentation

## 2022-05-22 DIAGNOSIS — K920 Hematemesis: Secondary | ICD-10-CM | POA: Diagnosis present

## 2022-05-22 DIAGNOSIS — J0110 Acute frontal sinusitis, unspecified: Secondary | ICD-10-CM

## 2022-05-22 DIAGNOSIS — K2900 Acute gastritis without bleeding: Secondary | ICD-10-CM | POA: Diagnosis not present

## 2022-05-22 DIAGNOSIS — Z7901 Long term (current) use of anticoagulants: Secondary | ICD-10-CM | POA: Diagnosis not present

## 2022-05-22 DIAGNOSIS — I10 Essential (primary) hypertension: Secondary | ICD-10-CM | POA: Diagnosis not present

## 2022-05-22 DIAGNOSIS — J45909 Unspecified asthma, uncomplicated: Secondary | ICD-10-CM | POA: Diagnosis not present

## 2022-05-22 DIAGNOSIS — Z79899 Other long term (current) drug therapy: Secondary | ICD-10-CM | POA: Diagnosis not present

## 2022-05-22 MED ORDER — AMOXICILLIN-POT CLAVULANATE 875-125 MG PO TABS: 1.0000 | ORAL_TABLET | Freq: Two times a day (BID) | ORAL | 0 refills | Status: DC

## 2022-05-22 NOTE — Progress Notes (Signed)
[facility] [address] [place] [address] Dept: [phone] Dept Fax: [phone]  Office Visit  Subjective:    Patient ID: Clarence Perez, male    DOB: 1960/10/08, 61 y.o..   MRN: [id number]  Chief Complaint  Patient presents with   Acute Visit    C/o having sinus congestion, HA x 14 days.  Has taken OTC decongestants with little relief.    History of Present Illness:  Patient is in today with a 14-day history of nasal and sinus congestion. 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 He has been using nasal saline washes and nasal saline spray. He is not having other significant respiratory symptoms.  Past Medical History: Patient Active Problem List   Diagnosis Date Noted   Mild intermittent asthma 12/11/2021   Adjustment insomnia 10/30/2021   Allergic rhinitis 09/29/2021   Atrial fibrillation with rapid ventricular response ([place]) 06/25/2021   Grade I diastolic dysfunction 13/24/4010   Hyperglycemia    Essential hypertension 11/04/2018   Hyperlipidemia 11/04/2018   Gastro-esophageal reflux disease with esophagitis 12/04/2012   History of prostate cancer 06/02/2012   Obstructive sleep apnea 09/20/2011   Past Surgical History:  Procedure Laterality Date   prostate cryoblation     Family History  Problem Relation Age of Onset   Cancer Mother        colon   Hypertension Mother    Cancer Father        prostate and kidney   CAD Maternal Uncle    Heart disease Paternal Uncle    Cancer Maternal Grandmother        Kidney   Outpatient Medications Prior to Visit  Medication Sig Dispense Refill   omeprazole (PRILOSEC) 40 MG capsule Take by mouth.     albuterol (VENTOLIN HFA) 108 (90 Base) MCG/ACT inhaler Inhale 2 puffs into the lungs every 4 (four) hours as needed for shortness of breath. 8 g 1   azelastine (ASTELIN) 0.1 % nasal spray USE 2 SPRAYS IN EACH  NOSTRIL TWICE DAILY AS DIRECTED 90 mL 3   carvedilol (COREG) 12.5 MG tablet TAKE 1 TABLET BY MOUTH TWICE  DAILY WITH A MEAL 60 tablet 0   chlorthalidone (HYGROTON) 25 MG tablet Take 1 tablet (25 mg total) by mouth daily. 90 tablet 3   diclofenac Sodium (VOLTAREN) 1 % GEL Apply 2 g topically as needed (sholduer pain). 300 g 1   diphenhydrAMINE (BENADRYL) 25 mg capsule Take by mouth.     ELIQUIS 5 MG TABS tablet TAKE 1 TABLET BY MOUTH TWICE  DAILY 180 tablet 3   EPINEPHrine 0.3 mg/0.3 mL IJ SOAJ injection Inject into the muscle.     eszopiclone (LUNESTA) 1 MG TABS tablet Take 1 tablet (1 mg total) by mouth at bedtime as needed for sleep. Take immediately before bedtime 30 tablet 2   fluticasone (FLONASE) 50 MCG/ACT nasal spray Place 1 spray into both nostrils in the morning and at bedtime.     levocetirizine (XYZAL) 5 MG tablet Take 1 tablet (5 mg total) by mouth every evening.     losartan (COZAAR) 100 MG tablet TAKE 1 TABLET BY MOUTH DAILY 90 tablet 3   montelukast (SINGULAIR) 10 MG tablet Take 1 tablet (10 mg total) by mouth at bedtime. 30 tablet 3   omeprazole (PRILOSEC) 40 MG capsule TAKE 1 CAPSULE BY MOUTH DAILY 90 capsule 3   oxymetazoline (NASAL RELIEF) 0.05 % nasal  spray Place into the nose.     potassium chloride SA (KLOR-CON M) 20 MEQ tablet Take 2 tablets (40 mEq total) by mouth daily. 180 tablet 3   White Petrolatum-Mineral Oil (ARTIFICIAL TEARS) OINT ophthalmic ointment Apply to eye.     colchicine 0.6 MG tablet Take 0.6 mg by mouth daily.     sucralfate (CARAFATE) 1 g tablet Take 1 g by mouth 4 (four) times daily.     No facility-administered medications prior to visit.   Allergies  Allergen Reactions   Ace Inhibitors Cough   Objective:   Today's Vitals   05/22/22 1500  BP: 136/80  Pulse: 86  Temp: 97.8 F (36.6 C)  TempSrc: Temporal  SpO2: 97%  Weight: 189 lb 9.6 oz (86 kg)  Height: 6' (1.829 m)   Body mass index is 25.71 kg/m.   General: Well developed, well  nourished. No acute distress. HEENT: Normocephalic, non-traumatic. PERRL, EOMI. Conjunctiva clear. Pain on   percussion over the right frontal sinus with mild increased warmth in this area. Nose    with mild congestion and rhinorrhea. Psych: Alert and oriented. Normal mood and affect.  Health Maintenance Due  Topic Date Due   HIV Screening  Never done   Hepatitis C Screening  Never done   TETANUS/TDAP  07/21/2019   Zoster Vaccines- Shingrix (2 of 2) 05/07/2022     Assessment & Plan:   1. Acute non-recurrent frontal sinusitis Appears ot have a sinusitis present. I will treat with 10-day course of Augmentin. he shodul continue his other routine approaches to his nasal symptoms.  - amoxicillin-clavulanate (AUGMENTIN) 875-125 MG tablet; Take 1 tablet by mouth 2 (two) times daily.  Dispense: 20 tablet; Refill: 0  Return if symptoms worsen or fail to improve.   Haydee Salter, MD

## 2022-05-22 NOTE — Telephone Encounter (Signed)
Has an appointment today. Dm/cma

## 2022-05-23 ENCOUNTER — Other Ambulatory Visit (INDEPENDENT_AMBULATORY_CARE_PROVIDER_SITE_OTHER): Payer: 59

## 2022-05-23 ENCOUNTER — Telehealth: Payer: Self-pay

## 2022-05-23 ENCOUNTER — Emergency Department (HOSPITAL_COMMUNITY): Payer: 59

## 2022-05-23 ENCOUNTER — Encounter (HOSPITAL_COMMUNITY): Payer: Self-pay | Admitting: Emergency Medicine

## 2022-05-23 ENCOUNTER — Encounter: Payer: Self-pay | Admitting: Family Medicine

## 2022-05-23 ENCOUNTER — Other Ambulatory Visit: Payer: Self-pay

## 2022-05-23 DIAGNOSIS — E876 Hypokalemia: Secondary | ICD-10-CM

## 2022-05-23 DIAGNOSIS — I10 Essential (primary) hypertension: Secondary | ICD-10-CM

## 2022-05-23 LAB — CBC
HCT: 43.3 % (ref 39.0–52.0)
Hemoglobin: 15 g/dL (ref 13.0–17.0)
MCH: 30.9 pg (ref 26.0–34.0)
MCHC: 34.6 g/dL (ref 30.0–36.0)
MCV: 89.3 fL (ref 80.0–100.0)
Platelets: 275 10*3/uL (ref 150–400)
RBC: 4.85 MIL/uL (ref 4.22–5.81)
RDW: 12.2 % (ref 11.5–15.5)
WBC: 14.2 10*3/uL — ABNORMAL HIGH (ref 4.0–10.5)
nRBC: 0 % (ref 0.0–0.2)

## 2022-05-23 LAB — COMPREHENSIVE METABOLIC PANEL
ALT: 24 U/L (ref 0–44)
AST: 21 U/L (ref 15–41)
Albumin: 4.3 g/dL (ref 3.5–5.0)
Alkaline Phosphatase: 84 U/L (ref 38–126)
Anion gap: 8 (ref 5–15)
BUN: 18 mg/dL (ref 8–23)
CO2: 30 mmol/L (ref 22–32)
Calcium: 9.3 mg/dL (ref 8.9–10.3)
Chloride: 99 mmol/L (ref 98–111)
Creatinine, Ser: 0.78 mg/dL (ref 0.61–1.24)
GFR, Estimated: >60 mL/min (ref >60)
Glucose, Bld: 129 mg/dL — ABNORMAL HIGH (ref 70–99)
Potassium: 3.2 mmol/L — ABNORMAL LOW (ref 3.5–5.1)
Sodium: 137 mmol/L (ref 135–145)
Total Bilirubin: 0.7 mg/dL (ref 0.3–1.2)
Total Protein: 7.8 g/dL (ref 6.5–8.1)

## 2022-05-23 LAB — TYPE AND SCREEN
ABO/RH(D): B POS
Antibody Screen: NEGATIVE

## 2022-05-23 LAB — HEMOGLOBIN AND HEMATOCRIT, BLOOD
HCT: 39.9 % (ref 39.0–52.0)
Hemoglobin: 13.6 g/dL (ref 13.0–17.0)

## 2022-05-23 LAB — ABO/RH: ABO/RH(D): B POS

## 2022-05-23 LAB — MAGNESIUM: Magnesium: 2.2 mg/dL (ref 1.5–2.5)

## 2022-05-23 MED ORDER — ONDANSETRON 4 MG PO TBDP: 4.0000 mg | ORAL_TABLET | Freq: Three times a day (TID) | ORAL | 0 refills | Status: DC | PRN

## 2022-05-23 MED ORDER — PANTOPRAZOLE 80MG IVPB - SIMPLE MED
80.0000 mg | Freq: Once | INTRAVENOUS | Status: AC
  Administered 2022-05-23: 80 mg via INTRAVENOUS
  Filled 2022-05-23: qty 80

## 2022-05-23 MED ORDER — ONDANSETRON HCL 4 MG/2ML IJ SOLN
4.0000 mg | Freq: Once | INTRAMUSCULAR | Status: AC
  Administered 2022-05-23: 4 mg via INTRAVENOUS
  Filled 2022-05-23: qty 2

## 2022-05-23 MED ORDER — IOHEXOL 300 MG/ML  SOLN
100.0000 mL | Freq: Once | INTRAMUSCULAR | Status: AC | PRN
  Administered 2022-05-23: 100 mL via INTRAVENOUS

## 2022-05-23 MED ORDER — PANTOPRAZOLE SODIUM 40 MG IV SOLR: 40.0000 mg | Freq: Two times a day (BID) | INTRAVENOUS | Status: DC

## 2022-05-23 MED ORDER — SODIUM CHLORIDE (PF) 0.9 % IJ SOLN
INTRAMUSCULAR | Status: AC
  Filled 2022-05-23: qty 50

## 2022-05-23 MED ORDER — PANTOPRAZOLE INFUSION (NEW) - SIMPLE MED
8.0000 mg/h | INTRAVENOUS | Status: DC
  Administered 2022-05-23: 8 mg/h via INTRAVENOUS
  Filled 2022-05-23: qty 80

## 2022-05-23 MED ORDER — SODIUM CHLORIDE 0.9 % IV BOLUS
1000.0000 mL | Freq: Once | INTRAVENOUS | Status: AC
  Administered 2022-05-23: 1000 mL via INTRAVENOUS

## 2022-05-23 MED ORDER — OMEPRAZOLE 20 MG PO CPDR: 20.0000 mg | DELAYED_RELEASE_CAPSULE | Freq: Every day | ORAL | 0 refills | Status: DC

## 2022-05-23 MED ORDER — SUCRALFATE 1 G PO TABS: 1.0000 g | ORAL_TABLET | Freq: Three times a day (TID) | ORAL | 0 refills | Status: DC

## 2022-05-23 NOTE — ED Triage Notes (Signed)
Pt is on eliquis for afib.  Has had one episode of bloody emesis 1 hour PTA.  Had a few bowel movements today but did not check for blood.

## 2022-05-23 NOTE — Telephone Encounter (Signed)
Called and spoke with patient. He has been scheduled for a new patient appt on Wednesday, 07/03/22 at 2:00 pm with Tye Savoy, NP. Pt has been advised to arrive by 1:45 pm. Pt is aware that his appt information will be available in MyChart.

## 2022-05-23 NOTE — Discharge Instructions (Signed)
You were today for 1 episode of bloody emesis.  You likely have some gastritis and may have a GI bug.  You should avoid anti-inflammatory medications and alcohol.  Follow-up with gastroenterology.  If you note recurrent bloody emesis, bloody stools, dark or tarry stools, you should be reevaluated immediately.

## 2022-05-23 NOTE — ED Provider Notes (Signed)
[facility] DEPT Provider Note   CSN: [id number] Arrival date & time: 05/22/22  2329     History  Chief Complaint  Patient presents with   Hematemesis    Clarence Perez is a 61 y.o. male.  HPI     This is a 61 year old male with a history of atrial fibrillation on Eliquis who presents with hematemesis.  Patient reports that he had 1 episode of bloody emesis prior to arrival.  He felt nauseated but has not had any significant abdominal pain.  He reports normal stools earlier today.  He did not note any blood in his stools.  No history of GI bleed.  Denies daily alcohol use, history of gastritis, reflux or peptic ulcers.  Denies any significant abdominal pain at this time.  Does report that he had 1 glass of port wine prior to this episode.  Home Medications Prior to Admission medications   Medication Sig Start Date End Date Taking? Authorizing Provider  albuterol (VENTOLIN HFA) 108 (90 Base) MCG/ACT inhaler Inhale 2 puffs into the lungs every 4 (four) hours as needed for shortness of breath. 12/11/21  Yes Haydee Salter, MD  amoxicillin-clavulanate (AUGMENTIN) 875-125 MG tablet Take 1 tablet by mouth 2 (two) times daily. 05/22/22  Yes Haydee Salter, MD  azelastine (ASTELIN) 0.1 % nasal spray USE 2 SPRAYS IN EACH NOSTRIL TWICE DAILY AS DIRECTED Patient taking differently: Place 2 sprays into both nostrils 2 (two) times daily. 02/16/21  Yes Cirigliano, Mary K, DO  carvedilol (COREG) 12.5 MG tablet TAKE 1 TABLET BY MOUTH TWICE  DAILY WITH A MEAL Patient taking differently: Take 12.5 mg by mouth 2 (two) times daily with a meal. 04/18/22  Yes Rudd, Lillette Boxer, MD  chlorthalidone (HYGROTON) 25 MG tablet Take 1 tablet (25 mg total) by mouth daily. 03/12/22  Yes Haydee Salter, MD  diclofenac Sodium (VOLTAREN) 1 % GEL Apply 2 g topically as needed (sholduer pain). 03/22/22  Yes Haydee Salter, MD  diphenhydrAMINE (BENADRYL) 25 mg capsule Take by mouth.   Yes  [provider]  ELIQUIS 5 MG TABS tablet TAKE 1 TABLET BY MOUTH TWICE  DAILY Patient taking differently: Take 5 mg by mouth. 01/28/22  Yes [name], [name], PA  EPINEPHrine 0.3 mg/0.3 mL IJ SOAJ injection Inject into the muscle. 09/18/21  Yes [provider]  eszopiclone (LUNESTA) 1 MG TABS tablet Take 1 tablet (1 mg total) by mouth at bedtime as needed for sleep. Take immediately before bedtime 01/09/22  Yes Haydee Salter, MD  fluticasone [facility]) 50 MCG/ACT nasal spray Place 1 spray into both nostrils in the morning and at bedtime.   Yes [provider]  levocetirizine (XYZAL) 5 MG tablet Take 1 tablet (5 mg total) by mouth every evening. 09/29/21  Yes Nche, Charlene Brooke, NP  losartan (COZAAR) 100 MG tablet TAKE 1 TABLET BY MOUTH DAILY 05/02/22  Yes Haydee Salter, MD  montelukast (SINGULAIR) 10 MG tablet Take 1 tablet (10 mg total) by mouth at bedtime. 09/29/21  Yes Nche, Charlene Brooke, NP  omeprazole (PRILOSEC) 20 MG capsule Take 1 capsule (20 mg total) by mouth daily. 05/23/22  Yes Bilaal Leib, Barbette Hair, MD  ondansetron (ZOFRAN-ODT) 4 MG disintegrating tablet Take 1 tablet (4 mg total) by mouth every 8 (eight) hours as needed for nausea or vomiting. 05/23/22  Yes Madigan Rosensteel, Barbette Hair, MD  oxymetazoline (NASAL RELIEF) 0.05 % nasal spray Place into the nose.   Yes [provider]  potassium chloride SA (KLOR-CON M) 20 MEQ tablet Take 2 tablets (40 mEq total) by mouth daily. 05/10/22  Yes Haydee Salter, MD  sucralfate (CARAFATE) 1 g tablet Take 1 tablet (1 g total) by mouth 4 (four) times daily -  with meals and at bedtime. 05/23/22  Yes Eustacio Ellen, Barbette Hair, MD  White Petrolatum-Mineral Oil (ARTIFICIAL TEARS) OINT ophthalmic ointment Apply to eye.   Yes [provider]  omeprazole (PRILOSEC) 40 MG capsule TAKE 1 CAPSULE BY MOUTH DAILY Patient taking differently: Take 40 mg by mouth daily. 12/18/21   Haydee Salter, MD      Allergies    Ace inhibitors     Review of Systems   Review of Systems  Constitutional:  Negative for fever.  Respiratory:  Negative for shortness of breath.   Cardiovascular:  Negative for chest pain.  Gastrointestinal:  Positive for nausea and vomiting. Negative for abdominal pain and blood in stool.  All other systems reviewed and are negative.   Physical Exam Updated Vital Signs BP 127/82   Pulse 83   Temp 98.2 F (36.8 C) (Oral)   Resp 15   Ht 1.829 m (6')   Wt 84.4 kg   SpO2 92%   BMI 25.23 kg/m  Physical Exam Vitals and nursing note reviewed.  Constitutional:      Appearance: He is well-developed. He is not ill-appearing.  HENT:     Head: Normocephalic and atraumatic.  Eyes:     Pupils: Pupils are equal, round, and reactive to light.  Cardiovascular:     Rate and Rhythm: Normal rate and regular rhythm.     Heart sounds: Normal heart sounds. No murmur heard. Pulmonary:     Effort: Pulmonary effort is normal. No respiratory distress.     Breath sounds: Normal breath sounds. No wheezing.  Abdominal:     General: Bowel sounds are normal.     Palpations: Abdomen is soft.     Tenderness: There is no abdominal tenderness. There is no guarding or rebound.  Musculoskeletal:     Cervical back: Neck supple.  Lymphadenopathy:     Cervical: No cervical adenopathy.  Skin:    General: Skin is warm and dry.  Neurological:     Mental Status: He is alert and oriented to person, place, and time.  Psychiatric:        Mood and Affect: Mood normal.     ED Results / Procedures / Treatments   Labs (all labs ordered are listed, but only abnormal results are displayed) Labs Reviewed  COMPREHENSIVE METABOLIC PANEL - Abnormal; Notable for the following components:      Result Value   Potassium 3.2 (*)    Glucose, Bld 129 (*)    All other components within normal limits  CBC - Abnormal; Notable for the following components:   WBC 14.2 (*)    All other components within normal limits  HEMOGLOBIN AND  HEMATOCRIT, BLOOD  POC OCCULT BLOOD, ED  TYPE AND SCREEN  ABO/RH    EKG None  Radiology CT ABDOMEN PELVIS W CONTRAST  Result Date: 05/23/2022 CLINICAL DATA:  Leukocytosis and hematemesis. EXAM: CT ABDOMEN AND PELVIS WITH CONTRAST TECHNIQUE: Multidetector CT imaging of the abdomen and pelvis was performed using the standard protocol following bolus administration of intravenous contrast. RADIATION DOSE REDUCTION: This exam was performed according to the departmental dose-optimization program which includes automated exposure control, adjustment of the mA and/or kV according to patient size and/or use of iterative reconstruction  technique. CONTRAST:  12m OMNIPAQUE IOHEXOL 300 MG/ML  SOLN COMPARISON:  Acute abdominal plain film series earlier today. FINDINGS: Lower chest: There is linear scarring or atelectasis in both lung bases and mild posterior basal lower lobe bronchiectasis. Lung bases are otherwise clear. There is a small hiatal hernia. The cardiac size is normal. There is no pericardial effusion. Hepatobiliary: The liver is 18 cm length and mildly steatotic without mass enhancement. The gallbladder and bile ducts are unremarkable. Pancreas: No abnormality. Spleen: No abnormality. Adrenals/Urinary Tract: There is no adrenal mass. No renal mass enhancement. Posteriorly in the lower right kidney there is a 1 cm homogeneous cyst measuring 10.4 Hounsfield units. Posteriorly in the lower left kidney there is a 1.4 cm homogeneous cyst of 11.1 Hounsfield units. Bilaterally there are several scattered additional subcentimeter cortical hypodensities which are too small to characterize. No follow-up imaging is recommended. JACR 2018 Feb; 264-273, Management of the Incidental Renal mass on CT, [employer] 2021; 814-848, Bosniak Classification of Cystic Renal Masses, Version 2019. There is no urinary stone or obstruction. The bladder is contracted and poorly visualized but could be thickened. Stomach/Bowel:  Hiatal hernia. There are thickened gastric folds but no masslike thickening or penetrating ulcer is seen. There is mucosal thickening in the jejunum, mucosal enhancement without thickening in the ileum. The appendix is normal caliber. Moderate stool retention ascending and transverse colon is seen. Left-sided diverticulosis greatest in the sigmoid without diverticulitis. Vascular/Lymphatic: No significant vascular findings are present. No enlarged abdominal or pelvic lymph nodes. Reproductive: No prostatomegaly. Other: There is no free air, free hemorrhage or free fluid. There is a small umbilical fat hernia. There are bilateral inguinal fat hernias larger on the right. There is no incarcerated hernia. Musculoskeletal: Advanced degenerative disc disease with spondylosis L5-S1. Mild osteopenia. No acute or other significant osseous findings. IMPRESSION: 1. Hiatal hernia with imaging findings of gastroenteritis. No bowel obstruction or mesenteric inflammatory change. 2. Constipation with left colonic diverticulosis greatest in the sigmoid. 3. Cystitis versus bladder nondistention. 4. Umbilical and right greater than left inguinal fat hernias. Electronically Signed   By: KTelford NabM.D.   On: 05/23/2022 05:32   DG Abdomen Acute W/Chest  Result Date: 05/23/2022 CLINICAL DATA:  Hematemesis. EXAM: DG ABDOMEN ACUTE WITH 1 VIEW CHEST COMPARISON:  None Available. FINDINGS: There is no evidence of dilated bowel loops or free intraperitoneal air. A large stool burden is seen within the transverse and descending colon. No radiopaque calculi or other significant radiographic abnormality is seen. Heart size and mediastinal contours are within normal limits. Mild atelectasis is seen within the lateral aspect of the left lung base. IMPRESSION: 1. Large stool burden without evidence of bowel obstruction. 2. Mild left basilar atelectasis. Electronically Signed   By: TVirgina NorfolkM.D.   On: 05/23/2022 01:32     Procedures Procedures    Medications Ordered in ED Medications  pantoprozole (PROTONIX) 80 mg /NS 100 mL infusion (0 mg/hr Intravenous Stopped 05/23/22 0612)  pantoprazole (PROTONIX) injection 40 mg (has no administration in time range)  ondansetron (ZOFRAN) injection 4 mg (4 mg Intravenous Given 05/23/22 0115)  pantoprazole (PROTONIX) 80 mg /NS 100 mL IVPB (0 mg Intravenous Stopped 05/23/22 0134)  sodium chloride 0.9 % bolus 1,000 mL (0 mLs Intravenous Stopped 05/23/22 0324)  iohexol (OMNIPAQUE) 300 MG/ML solution 100 mL (100 mLs Intravenous Contrast Given 05/23/22 0503)  sodium chloride (PF) 0.9 % injection (  Given 05/23/22 0514)    ED Course/ Medical Decision Making/ A&P Clinical Course  as of 05/23/22 0625  Thu May 23, 2022  0600 Patient has not had any recurrent episodes of emesis.  Repeat hemoglobin is 13.6.  Initial 15.  BUN is normal.  Patient states he feels somewhat nauseous but otherwise is feeling better.  His CT scan shows evidence of gastritis versus gastroenteritis.  He did have some alcohol last night but denies daily alcohol use.  Discussed with him that he needs to avoid alcohol and NSAIDs.  We will start on a PPI which she has previously been prescribed.  We will also prescribe Carafate and Zofran.  He has previously seen gastroenterology at [facility] but would like to follow-up with [facility].  He was provided with this follow-up information.  Given no evidence of ongoing bleeding, feel it is reasonable to be discharged with close follow-up.  Patient is agreeable to plan.  Given that he is on Eliquis, discussed with him that he if he has recurrent bleeding, bloody stools, or dark tarry stools, he needs to be reevaluated immediately.  Patient stated understanding. [CH]    Clinical Course User Index [CH] Sam Wunschel, Barbette Hair, MD                           Medical Decision Making Amount and/or Complexity of Data Reviewed Labs: ordered. Radiology:  ordered.  Risk Prescription drug management.   This patient presents to the ED for concern of hematemesis, this involves an extensive number of treatment options, and is a complaint that carries with it a high risk of complications and morbidity.  I considered the following differential and admission for this acute, potentially life threatening condition.  The differential diagnosis includes gastritis, gastroenteritis, peptic ulcer, variceal, Mallory-Weiss tear  MDM:    This is a 61 year old male who presents with 1 episode of hematemesis.  He is on Eliquis.  He denies any chronic alcohol use but did drink some wine this evening.  He has having nausea but no abdominal pain.  No history of the same.  Denies bloody or dark stools.  He is overall nontoxic-appearing and vital signs are reassuring.  He is not tachycardic or hemodynamically unstable.  Type and screen was sent.  Basic lab work was sent.  Patient was started on Protonix and given Zofran and fluids.  Initial hemoglobin is 15.  Slight hypokalemia at 3.2.  Patient did not have any recurrent hematemesis.  Repeat hemoglobin 13.6.  CT scan shows evidence of likely gastritis of the stomach.  There are several other incidental findings that are likely not related.  He also has a hiatal hernia.  States he feels reasonably better.  See clinical course below.  Feel it is reasonable to discharge with close GI follow-up given that he has not had any recurrent hematemesis.  Will start on omeprazole, Carafate, and Zofran.  He was given strict return precautions.  (Labs, imaging, consults)  Labs: I Ordered, and personally interpreted labs.  The pertinent results include: CBC, CMP, lipase, type and screen, repeat hemoglobin  Imaging Studies ordered: I ordered imaging studies including CT imaging I independently visualized and interpreted imaging. I agree with the radiologist interpretation  Additional history obtained from wife at bedside.  External  records from outside source obtained and reviewed including prior evaluations  Cardiac Monitoring: The patient was maintained on a cardiac monitor.  I personally viewed and interpreted the cardiac monitored which showed an underlying rhythm of: Sinus rhythm  Reevaluation: After the interventions noted  above, I reevaluated the patient and found that they have :improved  Social Determinants of Health: Lives independently  Disposition: Discharge  Co morbidities that complicate the patient evaluation  Past Medical History:  Diagnosis Date   Asthma    Cancer ([place])    prostate    GERD (gastroesophageal reflux disease)    Hypertension    Sleep apnea      Medicines Meds ordered this encounter  Medications   ondansetron (ZOFRAN) injection 4 mg   pantoprazole (PROTONIX) 80 mg /NS 100 mL IVPB   pantoprozole (PROTONIX) 80 mg /NS 100 mL infusion   pantoprazole (PROTONIX) injection 40 mg   sodium chloride 0.9 % bolus 1,000 mL   iohexol (OMNIPAQUE) 300 MG/ML solution 100 mL   sodium chloride (PF) 0.9 % injection    Angelica Chessman M: cabinet override   omeprazole (PRILOSEC) 20 MG capsule    Sig: Take 1 capsule (20 mg total) by mouth daily.    Dispense:  30 capsule    Refill:  0   sucralfate (CARAFATE) 1 g tablet    Sig: Take 1 tablet (1 g total) by mouth 4 (four) times daily -  with meals and at bedtime.    Dispense:  90 tablet    Refill:  0   ondansetron (ZOFRAN-ODT) 4 MG disintegrating tablet    Sig: Take 1 tablet (4 mg total) by mouth every 8 (eight) hours as needed for nausea or vomiting.    Dispense:  20 tablet    Refill:  0    I have reviewed the patients home medicines and have made adjustments as needed  Problem List / ED Course: Problem List Items Addressed This Visit   None Visit Diagnoses     Other acute gastritis, presence of bleeding unspecified    -  Primary   Hiatal hernia                       Final Clinical Impression(s) / ED Diagnoses Final  diagnoses:  Other acute gastritis, presence of bleeding unspecified  Hiatal hernia    Rx / DC Orders ED Discharge Orders          Ordered    omeprazole (PRILOSEC) 20 MG capsule  Daily        05/23/22 0621    sucralfate (CARAFATE) 1 g tablet  3 times daily with meals & bedtime        05/23/22 0621    ondansetron (ZOFRAN-ODT) 4 MG disintegrating tablet  Every 8 hours PRN        05/23/22 0621              Merryl Hacker, MD 05/23/22 [phone]

## 2022-05-28 ENCOUNTER — Encounter: Payer: Self-pay | Admitting: Family Medicine

## 2022-05-28 ENCOUNTER — Ambulatory Visit: Payer: 59 | Admitting: Family Medicine

## 2022-05-28 VITALS — Ht 72.0 in

## 2022-05-28 DIAGNOSIS — I10 Essential (primary) hypertension: Secondary | ICD-10-CM | POA: Diagnosis not present

## 2022-05-28 DIAGNOSIS — E876 Hypokalemia: Secondary | ICD-10-CM | POA: Diagnosis not present

## 2022-05-28 MED ORDER — CHLORTHALIDONE 25 MG PO TABS: 12.5000 mg | ORAL_TABLET | Freq: Every day | ORAL | 3 refills | Status: DC

## 2022-05-28 MED ORDER — AMILORIDE HCL 5 MG PO TABS: 5.0000 mg | ORAL_TABLET | Freq: Every day | ORAL | 5 refills | Status: DC

## 2022-05-28 NOTE — Progress Notes (Signed)
[facility] [address] [place] [address] Dept: [phone] Dept Fax: [phone]  Office Visit  Subjective:    Patient ID: Clarence Perez, male    DOB: 10-03-1960, 61 y.o..   MRN: [id number]  Chief Complaint  Patient presents with   Follow-up    F/u low potassium.      History of Present Illness:  Patient is in today for review of his ongoing issues with hypokalemia and his blood pressure control. 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 Back in the Winter, he was found to have a low potassium level. Despite supplementation, he has had persistent hypokalemia.  Past Medical History: Patient Active Problem List   Diagnosis Date Noted   Mild intermittent asthma 12/11/2021   Adjustment insomnia 10/30/2021   Allergic rhinitis 09/29/2021   Atrial fibrillation with rapid ventricular response ([place]) 06/25/2021   Grade I diastolic dysfunction 42/59/5638   Hyperglycemia    Essential hypertension 11/04/2018   Hyperlipidemia 11/04/2018   Gastro-esophageal reflux disease with esophagitis 12/04/2012   History of prostate cancer 06/02/2012   Obstructive sleep apnea 09/20/2011   Past Surgical History:  Procedure Laterality Date   prostate cryoblation     Family History  Problem Relation Age of Onset   Cancer Mother        colon   Hypertension Mother    Cancer Father        prostate and kidney   CAD Maternal Uncle    Heart disease Paternal Uncle    Cancer Maternal Grandmother        Kidney   Outpatient Medications Prior to Visit  Medication Sig Dispense Refill   albuterol (VENTOLIN HFA) 108 (90 Base) MCG/ACT inhaler Inhale 2 puffs into the lungs every 4 (four) hours as needed for shortness of breath. 8 g 1   amoxicillin-clavulanate (AUGMENTIN) 875-125 MG tablet Take 1 tablet by mouth 2 (two) times daily. 20 tablet 0    azelastine (ASTELIN) 0.1 % nasal spray USE 2 SPRAYS IN EACH NOSTRIL TWICE DAILY AS DIRECTED (Patient taking differently: Place 2 sprays into both nostrils 2 (two) times daily.) 90 mL 3   carvedilol (COREG) 12.5 MG tablet TAKE 1 TABLET BY MOUTH TWICE  DAILY WITH A MEAL (Patient taking differently: Take 12.5 mg by mouth 2 (two) times daily with a meal.) 60 tablet 0   diclofenac Sodium (VOLTAREN) 1 % GEL Apply 2 g topically as needed (sholduer pain). 300 g 1   diphenhydrAMINE (BENADRYL) 25 mg capsule Take by mouth.     EPINEPHrine 0.3 mg/0.3 mL IJ SOAJ injection Inject into the muscle.     eszopiclone (LUNESTA) 1 MG TABS tablet Take 1 tablet (1 mg total) by mouth at bedtime as needed for sleep. Take immediately before bedtime 30 tablet 2   fluticasone (FLONASE) 50 MCG/ACT nasal spray Place 1 spray into both nostrils in the morning and at bedtime.     levocetirizine (XYZAL) 5 MG tablet Take 1 tablet (5 mg total) by mouth every evening.     losartan (COZAAR) 100 MG tablet TAKE 1 TABLET BY MOUTH DAILY 90 tablet 3   montelukast (SINGULAIR) 10 MG tablet Take 1 tablet (10 mg total) by mouth at bedtime. 30 tablet 3   omeprazole (PRILOSEC) 20 MG capsule Take 1 capsule (20 mg total) by mouth daily. 30 capsule 0   omeprazole (PRILOSEC) 40 MG capsule  TAKE 1 CAPSULE BY MOUTH DAILY (Patient taking differently: Take 40 mg by mouth daily.) 90 capsule 3   ondansetron (ZOFRAN-ODT) 4 MG disintegrating tablet Take 1 tablet (4 mg total) by mouth every 8 (eight) hours as needed for nausea or vomiting. 20 tablet 0   oxymetazoline (NASAL RELIEF) 0.05 % nasal spray Place into the nose.     potassium chloride SA (KLOR-CON M) 20 MEQ tablet Take 2 tablets (40 mEq total) by mouth daily. 180 tablet 3   sucralfate (CARAFATE) 1 g tablet Take 1 tablet (1 g total) by mouth 4 (four) times daily -  with meals and at bedtime. 90 tablet 0   White Petrolatum-Mineral Oil (ARTIFICIAL TEARS) OINT ophthalmic ointment Apply to eye.      chlorthalidone (HYGROTON) 25 MG tablet Take 1 tablet (25 mg total) by mouth daily. 90 tablet 3   ELIQUIS 5 MG TABS tablet TAKE 1 TABLET BY MOUTH TWICE  DAILY (Patient not taking: Reported on 05/28/2022) 180 tablet 3   No facility-administered medications prior to visit.   Allergies  Allergen Reactions   Ace Inhibitors Cough   Objective:   Today's Vitals   05/28/22 1253  Height: 6' (1.829 m)   Body mass index is 25.23 kg/m.   General: Well developed, well nourished. No acute distress. Psych: Alert and oriented. Normal mood and affect.  Health Maintenance Due  Topic Date Due   HIV Screening  Never done   Hepatitis C Screening  Never done   TETANUS/TDAP  07/21/2019   Zoster Vaccines- Shingrix (2 of 2) 05/07/2022   Lab Results    Latest Ref Rng & Units 05/23/2022   12:56 AM 05/08/2022    1:45 PM 09/21/2021   10:03 PM  BMP  Glucose 70 - 99 mg/dL 129  89  109   BUN 8 - 23 mg/dL '18  19  17   '$ Creatinine 0.61 - 1.24 mg/dL 0.78  0.99  0.91   Sodium 135 - 145 mmol/L 137  139  135   Potassium 3.5 - 5.1 mmol/L 3.2  3.3  3.3   Chloride 98 - 111 mmol/L 99  97  101   CO2 22 - 32 mmol/L 30  32  27   Calcium 8.9 - 10.3 mg/dL 9.3  9.6  8.9      Assessment & Plan:   1. Hypokalemia Dr. Vilinda Flake has renal potassium wasting secondary to chronic diuretic use. Further potassium supplementation is likely to only modestly raise the serum potassium level.  Amiloride is recommended to counteract this effect. I will reduce the dose of the chlorthalidone and add amiloride. We will continue KCL 40 meq daily. 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  - Basic metabolic panel; Future  2. Essential hypertension Changes to diuretics as noted above. We will reassess his BP control in 1 month.  - chlorthalidone (HYGROTON) 25 MG tablet; Take 0.5 tablets (12.5 mg total) by mouth daily.  Dispense: 45 tablet; Refill: 3 - aMILoride (MIDAMOR) 5  MG tablet; Take 1 tablet (5 mg total) by mouth daily.  Dispense: 30 tablet; Refill: 5  Return in about 4 weeks (around 06/25/2022) for Reassessment.   Haydee Salter, MD

## 2022-05-31 ENCOUNTER — Other Ambulatory Visit: Payer: Self-pay | Admitting: Family Medicine

## 2022-05-31 MED ORDER — CHLORTHALIDONE 15 MG PO TABS: 15.0000 mg | ORAL_TABLET | Freq: Every day | ORAL | 3 refills | Status: DC

## 2022-05-31 NOTE — Addendum Note (Signed)
Addended by: Haydee Salter on: 05/31/2022 03:36 PM   Modules accepted: Orders

## 2022-06-03 ENCOUNTER — Encounter: Payer: Self-pay | Admitting: Family Medicine

## 2022-06-03 DIAGNOSIS — F5102 Adjustment insomnia: Secondary | ICD-10-CM

## 2022-06-03 NOTE — Telephone Encounter (Signed)
Refill request for  Lunesta  1 mg LR 01/09/22, #30, 2 rf LOV 05/28/22 FOV 06/26/22  Please review and advise.  Thanks. Dm/cma

## 2022-06-05 ENCOUNTER — Other Ambulatory Visit: Payer: 59

## 2022-06-12 ENCOUNTER — Other Ambulatory Visit (INDEPENDENT_AMBULATORY_CARE_PROVIDER_SITE_OTHER): Payer: 59

## 2022-06-12 DIAGNOSIS — E876 Hypokalemia: Secondary | ICD-10-CM

## 2022-06-12 LAB — BASIC METABOLIC PANEL
BUN: 19 mg/dL (ref 6–23)
CO2: 31 mEq/L (ref 19–32)
Calcium: 9.6 mg/dL (ref 8.4–10.5)
Chloride: 99 mEq/L (ref 96–112)
Creatinine, Ser: 0.9 mg/dL (ref 0.40–1.50)
GFR: 91.9 mL/min (ref >60.00)
Glucose, Bld: 114 mg/dL — ABNORMAL HIGH (ref 70–99)
Potassium: 4 mEq/L (ref 3.5–5.1)
Sodium: 137 mEq/L (ref 135–145)

## 2022-06-26 ENCOUNTER — Encounter: Payer: Self-pay | Admitting: Family Medicine

## 2022-06-26 ENCOUNTER — Ambulatory Visit: Payer: 59 | Admitting: Family Medicine

## 2022-06-26 VITALS — BP 136/82 | HR 75 | Temp 97.4°F | Ht 72.0 in | Wt 184.6 lb

## 2022-06-26 DIAGNOSIS — I10 Essential (primary) hypertension: Secondary | ICD-10-CM | POA: Diagnosis not present

## 2022-06-26 DIAGNOSIS — L210 Seborrhea capitis: Secondary | ICD-10-CM

## 2022-06-26 DIAGNOSIS — E876 Hypokalemia: Secondary | ICD-10-CM

## 2022-06-26 DIAGNOSIS — M25562 Pain in left knee: Secondary | ICD-10-CM

## 2022-06-26 MED ORDER — CHLORTHALIDONE 25 MG PO TABS: 25.0000 mg | ORAL_TABLET | Freq: Every day | ORAL | 3 refills | Status: DC

## 2022-06-26 NOTE — Progress Notes (Signed)
[facility] [address] [place] [address] Dept: [phone] Dept Fax: [phone]  Chronic Care Office Visit  Subjective:    Patient ID: Clarence Perez, male    DOB: [birth year], 61 y.o..   MRN: [id number]  Chief Complaint  Patient presents with   Follow-up    4 week f/u. No concerns.     History of Present Illness:  Patient is in today for reassessment of his potassium and his blood pressure. 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 Back in the Winter, he was found to have a low potassium level. Despite supplementation, he has had persistent hypokalemia. At his last visit in early Oct., we tried decreasing his chlorthalidone dose and adding amiloride to his therapy in order to help raise his potassium level. He has also continued on his daily potassium supplement (40 mEq daily). 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  Dr. Vilinda Flake  also mentions an issue with chronic left knee pain and mild popping. He has considered that he may need a steroid injection to calm this down, but wants to hold off of this for now.  Dr. Vilinda Flake also notes that he has an issue with some recurrent small bumps on his scalp int he nape area. He does use an OTC dandruff shampoo.  Past Medical History: Patient Active Problem List   Diagnosis Date Noted   Hypokalemia 06/26/2022   Left anterior knee pain 06/26/2022   Mild intermittent asthma 12/11/2021   Adjustment insomnia 10/30/2021   Allergic rhinitis 09/29/2021   Atrial fibrillation with rapid ventricular response ([place]) 06/25/2021   Grade I diastolic dysfunction 46/50/3546   Hyperglycemia    Essential hypertension 11/04/2018   Hyperlipidemia 11/04/2018   Gastro-esophageal reflux disease with esophagitis 12/04/2012   History of prostate cancer 06/02/2012   Obstructive sleep apnea  09/20/2011   Past Surgical History:  Procedure Laterality Date   prostate cryoblation     Family History  Problem Relation Age of Onset   Cancer Mother        colon   Hypertension Mother    Cancer Father        prostate and kidney   CAD Maternal Uncle    Heart disease Paternal Uncle    Cancer Maternal Grandmother        Kidney   Outpatient Medications Prior to Visit  Medication Sig Dispense Refill   albuterol (VENTOLIN HFA) 108 (90 Base) MCG/ACT inhaler Inhale 2 puffs into the lungs every 4 (four) hours as needed for shortness of breath. 8 g 1   aMILoride (MIDAMOR) 5 MG tablet Take 1 tablet (5 mg total) by mouth daily. 30 tablet 5   azelastine (ASTELIN) 0.1 % nasal spray USE 2 SPRAYS IN EACH NOSTRIL TWICE DAILY AS DIRECTED (Patient taking differently: Place 2 sprays into both nostrils 2 (two) times daily.) 90 mL 3   carvedilol (COREG) 12.5 MG tablet TAKE 1 TABLET BY MOUTH TWICE  DAILY WITH A MEAL (Patient taking differently: Take 12.5 mg by mouth 2 (two) times daily with a meal.) 60 tablet 0   chlorthalidone (HYGROTON) 25 MG tablet Take 12.5 mg by mouth every morning.     diclofenac Sodium (VOLTAREN) 1 % GEL Apply 2 g topically as needed (sholduer pain). 300 g 1   ELIQUIS 5 MG TABS tablet TAKE 1 TABLET BY MOUTH TWICE  DAILY 180 tablet 3  EPINEPHrine 0.3 mg/0.3 mL IJ SOAJ injection Inject into the muscle.     eszopiclone (LUNESTA) 1 MG TABS tablet Take 1 tablet (1 mg total) by mouth at bedtime as needed for sleep. Take immediately before bedtime 30 tablet 2   fluticasone (FLONASE) 50 MCG/ACT nasal spray Place 1 spray into both nostrils in the morning and at bedtime.     levocetirizine (XYZAL) 5 MG tablet Take 1 tablet (5 mg total) by mouth every evening.     losartan (COZAAR) 100 MG tablet TAKE 1 TABLET BY MOUTH DAILY 90 tablet 3   montelukast (SINGULAIR) 10 MG tablet Take 1 tablet (10 mg total) by mouth at bedtime. 30 tablet 3   omeprazole (PRILOSEC) 40 MG capsule TAKE 1 CAPSULE BY  MOUTH DAILY (Patient taking differently: Take 40 mg by mouth daily.) 90 capsule 3   ondansetron (ZOFRAN-ODT) 4 MG disintegrating tablet Take 1 tablet (4 mg total) by mouth every 8 (eight) hours as needed for nausea or vomiting. 20 tablet 0   oxymetazoline (NASAL RELIEF) 0.05 % nasal spray Place into the nose.     potassium chloride SA (KLOR-CON M) 20 MEQ tablet Take 2 tablets (40 mEq total) by mouth daily. 180 tablet 3   White Petrolatum-Mineral Oil (ARTIFICIAL TEARS) OINT ophthalmic ointment Apply to eye.     chlorthalidone (HYGROTEN) 15 MG tablet Take 1 tablet (15 mg total) by mouth daily. 90 tablet 3   diphenhydrAMINE (BENADRYL) 25 mg capsule Take by mouth.     amoxicillin-clavulanate (AUGMENTIN) 875-125 MG tablet Take 1 tablet by mouth 2 (two) times daily. 20 tablet 0   omeprazole (PRILOSEC) 20 MG capsule Take 1 capsule (20 mg total) by mouth daily. 30 capsule 0   sucralfate (CARAFATE) 1 g tablet Take 1 tablet (1 g total) by mouth 4 (four) times daily -  with meals and at bedtime. 90 tablet 0   No facility-administered medications prior to visit.   Allergies  Allergen Reactions   Ace Inhibitors Cough   Objective:   Today's Vitals   06/26/22 1256  BP: 136/82  Pulse: 75  Temp: (!) 97.4 F (36.3 C)  TempSrc: Temporal  SpO2: 95%  Weight: 184 lb 9.6 oz (83.7 kg)  Height: 6' (1.829 m)   Body mass index is 25.04 kg/m.   General: Well developed, well nourished. No acute distress. Extremities: Full ROM of left knee. No joint swelling or tenderness. Mild crepitance, esp.   around the medial patella. No edema noted. Scalp: There are a few small flesh-colored papules present. Tehre is generalized scaliness   associated with the hair follicles. Psych: Alert and oriented. Normal mood and affect.  Health Maintenance Due  Topic Date Due   HIV Screening  Never done   Hepatitis C Screening  Never done   TETANUS/TDAP  07/21/2019   Zoster Vaccines- Shingrix (2 of 2) 05/07/2022   Lab  Results:    Latest Ref Rng & Units 06/12/2022    1:10 PM 05/23/2022   12:56 AM 05/08/2022    1:45 PM  BMP  Glucose 70 - 99 mg/dL 114  129  89   BUN 6 - 23 mg/dL '19  18  19   '$ Creatinine 0.40 - 1.50 mg/dL 0.90  0.78  0.99   Sodium 135 - 145 mEq/L 137  137  139   Potassium 3.5 - 5.1 mEq/L 4.0  3.2  3.3   Chloride 96 - 112 mEq/L 99  99  97   CO2 19 -  32 mEq/L 31  30  32   Calcium 8.4 - 10.5 mg/dL 9.6  9.3  9.6    Assessment & Plan:   1. Essential hypertension Dr. Vilinda Flake ' blood pressure is mildly high today. He was unable to obtain the 15 mg dose of  chlorthalidone. He has tried splitting the 25 mg tabs, but this is difficult. His recent potassium is now improved. I recommend we increase the chlorthalidone back to 25 mg and see how he does with this. I will repeat his potassium at his next visit.  - chlorthalidone (HYGROTON) 25 MG tablet; Take 1 tablet (25 mg total) by mouth daily.  Dispense: 90 tablet; Refill: 3  2. Hypokalemia Improved. Continue to take potassium chloride 20 mEq 2 tabs daily.  3. Left anterior knee pain Likely does have some degenerative issues. If he reaches a point where he would like to try a steroid injection, he can make an appointment with me for this.  4. Dandruff I recommend he try using one of the coal tar shampoos for this.  Return for As scheduled.   Haydee Salter, MD

## 2022-07-03 ENCOUNTER — Ambulatory Visit: Payer: 59 | Admitting: Nurse Practitioner

## 2022-07-03 ENCOUNTER — Telehealth: Payer: Self-pay

## 2022-07-03 ENCOUNTER — Encounter: Payer: Self-pay | Admitting: Nurse Practitioner

## 2022-07-03 VITALS — BP 136/82 | HR 84 | Ht 72.0 in | Wt 183.3 lb

## 2022-07-03 DIAGNOSIS — R933 Abnormal findings on diagnostic imaging of other parts of digestive tract: Secondary | ICD-10-CM | POA: Diagnosis not present

## 2022-07-03 DIAGNOSIS — K920 Hematemesis: Secondary | ICD-10-CM

## 2022-07-03 DIAGNOSIS — K219 Gastro-esophageal reflux disease without esophagitis: Secondary | ICD-10-CM | POA: Diagnosis not present

## 2022-07-03 DIAGNOSIS — R112 Nausea with vomiting, unspecified: Secondary | ICD-10-CM

## 2022-07-03 NOTE — Telephone Encounter (Signed)
[facility] Pre-operative Risk Assessment     Request for surgical clearance:   Endoscopy Procedure  What type of surgery is being performed?     Endoscopy  When is this surgery scheduled?   TBD  What type of clearance is required ?   Pharmacy  Are there any medications that need to be held prior to surgery and how long? Eliquis for 2 days  Practice name and name of physician performing surgery?  Dr. Stev[facility]  What is your office phone and fax number?      Phone- [phone]  [phone]  Anesthesia type (None, local, MAC, general) ?       MAC

## 2022-07-03 NOTE — Progress Notes (Signed)
Chief Complaint: Episode of vomiting blood   Assessment & Plan   # 61 yo male with one episode of possible hematemesis on Eliquis prompting ED visit in October. Query if N/V secondary to possible gastroenteritis suggested on CT scan and maybe it was port wine he vomited and not blood. BUN was normal. Hgb did decline from around 15 to 13 but after IVF. Still with some intermittent nausea.  Schedule for EGD to rule out upper GI tract lesion.  The risks and benefits of EGD with possible biopsies were discussed with the patient who agrees to proceed.  Continue daily PPI until after EGD  # ? Gastroenteritis on CT scan in October, see above. Still with some mild intermittent nausea. Possibly post infectious gastroparesis.      # AFIB, on Eliquis.  Hold Eliquis for 2 days before procedure - will instruct when and how to resume after procedure. Patient understands that there is a low but real risk of cardiovascular event such as heart attack, stroke, or embolism /  thrombosis while off blood thinner. The patient consents to proceed. Will communicate by phone or EMR with patient's prescribing provider to confirm that holding Eliquis is reasonable in this case.    HPI:    Clarence Perez is a 62 y.o. year old male with a past medical history of AFib on Eliquis, OSA, HTN, GERD, hiatal hernia  See PMH / [place] for additional history  Patient had a colonoscopy with [facility] in Feb 2023.  See report below  He is here for evaluation after a recent ED visit for hematemesis.  He developed nausea on 10/5 then had an episode of vomiting.  He vomited once. Emesis contained what appeared to be food particles mixed with some medium red blood. He didn't have any blood in his stool, no black stools.   ED Evaluation:  Hgb 15 but declined to 13.5 ( ? After IVF) BUN normal.  CTAP with contrast   IMPRESSION: 1. Hiatal hernia with imaging findings of gastroenteritis. No bowel obstruction or mesenteric  inflammatory change. 2. Constipation with left colonic diverticulosis greatest in the sigmoid. 3. Cystitis versus bladder nondistention. 4. Umbilical and right greater than left inguinal fat hernias.  He has continued to have intermittent nausea without vomiting. He occasionally gets reflux symptoms and takes PPI as needed  but has been taking PPI everyday since ED visit. Takes Eliquis. Doesn't take NSAIDs.   He has been trying to lose weight and has successfully lost 30 pounds over the last year  Previous Labs / Imaging::    Latest Ref Rng & Units 05/23/2022    4:47 AM 05/23/2022   12:56 AM 09/21/2021   10:03 PM  CBC  WBC 4.0 - 10.5 K/uL  14.2  7.4   Hemoglobin 13.0 - 17.0 g/dL 13.6  15.0  15.2   Hematocrit 39.0 - 52.0 % 39.9  43.3  43.2   Platelets 150 - 400 K/uL  275  249     No results found for: "LIPASE"    Latest Ref Rng & Units 06/12/2022    1:10 PM 05/23/2022   12:56 AM 05/08/2022    1:45 PM  CMP  Glucose 70 - 99 mg/dL 114  129  89   BUN 6 - 23 mg/dL '19  18  19   '$ Creatinine 0.40 - 1.50 mg/dL 0.90  0.78  0.99   Sodium 135 - 145 mEq/L 137  137  139   Potassium 3.5 -  5.1 mEq/L 4.0  3.2  3.3   Chloride 96 - 112 mEq/L 99  99  97   CO2 19 - 32 mEq/L 31  30  32   Calcium 8.4 - 10.5 mg/dL 9.6  9.3  9.6   Total Protein 6.5 - 8.1 g/dL  7.8    Total Bilirubin 0.3 - 1.2 mg/dL  0.7    Alkaline Phos 38 - 126 U/L  84    AST 15 - 41 U/L  21    ALT 0 - 44 U/L  24      Previous GI Evaluation   Imaging:  CT ABDOMEN PELVIS W CONTRAST CLINICAL DATA:  Leukocytosis and hematemesis.  EXAM: CT ABDOMEN AND PELVIS WITH CONTRAST  TECHNIQUE: Multidetector CT imaging of the abdomen and pelvis was performed using the standard protocol following bolus administration of intravenous contrast.  RADIATION DOSE REDUCTION: This exam was performed according to the departmental dose-optimization program which includes automated exposure control, adjustment of the mA and/or kV according  to patient size and/or use of iterative reconstruction technique.  CONTRAST:  11m OMNIPAQUE IOHEXOL 300 MG/ML  SOLN  COMPARISON:  Acute abdominal plain film series earlier today.  FINDINGS: Lower chest: There is linear scarring or atelectasis in both lung bases and mild posterior basal lower lobe bronchiectasis. Lung bases are otherwise clear.  There is a small hiatal hernia. The cardiac size is normal. There is no pericardial effusion.  Hepatobiliary: The liver is 18 cm length and mildly steatotic without mass enhancement. The gallbladder and bile ducts are unremarkable.  Pancreas: No abnormality.  Spleen: No abnormality.  Adrenals/Urinary Tract: There is no adrenal mass. No renal mass enhancement. Posteriorly in the lower right kidney there is a 1 cm homogeneous cyst measuring 10.4 Hounsfield units. Posteriorly in the lower left kidney there is a 1.4 cm homogeneous cyst of 11.1 Hounsfield units.  Bilaterally there are several scattered additional subcentimeter cortical hypodensities which are too small to characterize.  No follow-up imaging is recommended. JACR 2018 Feb; 264-273, Management of the Incidental Renal mass on CT, [employer] 2021; 814-848, Bosniak Classification of Cystic Renal Masses, Version 2019.  There is no urinary stone or obstruction. The bladder is contracted and poorly visualized but could be thickened.  Stomach/Bowel: Hiatal hernia. There are thickened gastric folds but no masslike thickening or penetrating ulcer is seen. There is mucosal thickening in the jejunum, mucosal enhancement without thickening in the ileum. The appendix is normal caliber. Moderate stool retention ascending and transverse colon is seen. Left-sided diverticulosis greatest in the sigmoid without diverticulitis.  Vascular/Lymphatic: No significant vascular findings are present. No enlarged abdominal or pelvic lymph nodes.  Reproductive: No prostatomegaly.  Other:  There is no free air, free hemorrhage or free fluid. There is a small umbilical fat hernia. There are bilateral inguinal fat hernias larger on the right. There is no incarcerated hernia.  Musculoskeletal: Advanced degenerative disc disease with spondylosis L5-S1. Mild osteopenia. No acute or other significant osseous findings.  IMPRESSION: 1. Hiatal hernia with imaging findings of gastroenteritis. No bowel obstruction or mesenteric inflammatory change. 2. Constipation with left colonic diverticulosis greatest in the sigmoid. 3. Cystitis versus bladder nondistention. 4. Umbilical and right greater than left inguinal fat hernias.  Electronically Signed   By: KTelford NabM.D.   On: 05/23/2022 05:32 DG Abdomen Acute W/Chest CLINICAL DATA:  Hematemesis.  EXAM: DG ABDOMEN ACUTE WITH 1 VIEW CHEST  COMPARISON:  None Available.  FINDINGS: There is no evidence of dilated bowel  loops or free intraperitoneal air. A large stool burden is seen within the transverse and descending colon. No radiopaque calculi or other significant radiographic abnormality is seen. Heart size and mediastinal contours are within normal limits. Mild atelectasis is seen within the lateral aspect of the left lung base.  IMPRESSION: 1. Large stool burden without evidence of bowel obstruction. 2. Mild left basilar atelectasis.  Electronically Signed   By: Virgina Norfolk M.D.   On: 05/23/2022 01:32    Past Medical History:  Diagnosis Date   Asthma    Cancer ([place])    prostate    GERD (gastroesophageal reflux disease)    Hypertension    Sleep apnea    Past Surgical History:  Procedure Laterality Date   prostate cryoblation     Family History  Problem Relation Age of Onset   Cancer Mother        colon   Hypertension Mother    Cancer Father        prostate and kidney   CAD Maternal Uncle    Heart disease Paternal Uncle    Cancer Maternal Grandmother        Kidney   Social History    Tobacco Use   Smoking status: Never   Smokeless tobacco: Never  Vaping Use   Vaping Use: Never used  Substance Use Topics   Alcohol use: Yes    Alcohol/week: 7.0 standard drinks of alcohol    Types: 7 Cans of beer per week    Comment: 1 beer daily 07/02/21   Drug use: Never   Current Outpatient Medications  Medication Sig Dispense Refill   albuterol (VENTOLIN HFA) 108 (90 Base) MCG/ACT inhaler Inhale 2 puffs into the lungs every 4 (four) hours as needed for shortness of breath. 8 g 1   aMILoride (MIDAMOR) 5 MG tablet Take 1 tablet (5 mg total) by mouth daily. 30 tablet 5   azelastine (ASTELIN) 0.1 % nasal spray USE 2 SPRAYS IN EACH NOSTRIL TWICE DAILY AS DIRECTED (Patient taking differently: Place 2 sprays into both nostrils 2 (two) times daily.) 90 mL 3   carvedilol (COREG) 12.5 MG tablet TAKE 1 TABLET BY MOUTH TWICE  DAILY WITH A MEAL (Patient taking differently: Take 12.5 mg by mouth 2 (two) times daily with a meal.) 60 tablet 0   chlorthalidone (HYGROTON) 25 MG tablet Take 1 tablet (25 mg total) by mouth daily. 90 tablet 3   diclofenac Sodium (VOLTAREN) 1 % GEL Apply 2 g topically as needed (sholduer pain). 300 g 1   ELIQUIS 5 MG TABS tablet TAKE 1 TABLET BY MOUTH TWICE  DAILY 180 tablet 3   EPINEPHrine 0.3 mg/0.3 mL IJ SOAJ injection Inject into the muscle.     eszopiclone (LUNESTA) 1 MG TABS tablet Take 1 tablet (1 mg total) by mouth at bedtime as needed for sleep. Take immediately before bedtime 30 tablet 2   fluticasone (FLONASE) 50 MCG/ACT nasal spray Place 1 spray into both nostrils in the morning and at bedtime.     levocetirizine (XYZAL) 5 MG tablet Take 1 tablet (5 mg total) by mouth every evening.     losartan (COZAAR) 100 MG tablet TAKE 1 TABLET BY MOUTH DAILY 90 tablet 3   montelukast (SINGULAIR) 10 MG tablet Take 1 tablet (10 mg total) by mouth at bedtime. 30 tablet 3   omeprazole (PRILOSEC) 40 MG capsule TAKE 1 CAPSULE BY MOUTH DAILY (Patient taking differently: Take  40 mg by mouth daily.) 90 capsule 3  ondansetron (ZOFRAN-ODT) 4 MG disintegrating tablet Take 1 tablet (4 mg total) by mouth every 8 (eight) hours as needed for nausea or vomiting. 20 tablet 0   oxymetazoline (NASAL RELIEF) 0.05 % nasal spray Place into the nose.     potassium chloride SA (KLOR-CON M) 20 MEQ tablet Take 2 tablets (40 mEq total) by mouth daily. 180 tablet 3   White Petrolatum-Mineral Oil (ARTIFICIAL TEARS) OINT ophthalmic ointment Apply to eye.     chlorthalidone (HYGROTON) 25 MG tablet Take 12.5 mg by mouth every morning.     No current facility-administered medications for this visit.   Allergies  Allergen Reactions   Ace Inhibitors Cough     Review of Systems: Positive for allergy, sinus trouble, anxiety, back pain.  All other systems reviewed and negative except where noted in HPI.   Wt Readings from Last 3 Encounters:  07/03/22 183 lb 5 oz (83.2 kg)  06/26/22 184 lb 9.6 oz (83.7 kg)  05/22/22 186 lb (84.4 kg)    Physical Exam   BP 136/82   Pulse 84   Ht 6' (1.829 m)   Wt 183 lb 5 oz (83.2 kg)   BMI 24.86 kg/m  Constitutional:  Generally well appearing male in no acute distress. Psychiatric: Pleasant. Normal mood and affect. Behavior is normal. EENT: Pupils normal.  Conjunctivae are normal. No scleral icterus. Neck supple.  Cardiovascular: Normal rate, regular rhythm. No edema Pulmonary/chest: Effort normal and breath sounds normal. No wheezing, rales or rhonchi. Abdominal: Soft, nondistended, nontender. Bowel sounds active throughout. There are no masses palpable. No hepatomegaly. Neurological: Alert and oriented to person place and time. Skin: Skin is warm and dry. No rashes noted.  Tye Savoy, NP  07/03/2022, 2:12 PM

## 2022-07-03 NOTE — Patient Instructions (Addendum)
Continue omeprazole daily.  It has been recommended to you by your physician that you have an Endoscopy completed. Per your request, we did not schedule the procedure(s) today. Please contact our office at [phone] should you decide to have the procedure completed. You will be scheduled for a pre-visit and procedure at that time.   You will be contaced by our office prior to your procedure for directions on holding your Eliquis.  If you do not hear from our office 1 week prior to your scheduled procedure, please call [phone] to discuss.   Due to recent changes in healthcare laws, you may see the results of your imaging and laboratory studies on MyChart before your provider has had a chance to review them.  We understand that in some cases there may be results that are confusing or concerning to you. Not all laboratory results come back in the same time frame and the provider may be waiting for multiple results in order to interpret others.  Please give [place] 48 hours in order for your provider to thoroughly review all the results before contacting the office for clarification of your results.     Thank you for choosing me and [facility].  Tye Savoy, NP

## 2022-07-04 NOTE — Telephone Encounter (Signed)
Received fax from Dr. Anson Crofts office stating that patient can hold Eliquis 2 days prior to procedure. Patient will be notified once patient calls back with a date to schedule his colonoscopy. Fax sent to scan.

## 2022-07-09 ENCOUNTER — Encounter: Payer: Self-pay | Admitting: Gastroenterology

## 2022-07-09 NOTE — Telephone Encounter (Signed)
Patient has called back and scheduled his procedure, have notified him to hold Eliquis 2 days prior to procedure. If any additional information please advise.

## 2022-07-29 ENCOUNTER — Encounter: Payer: Self-pay | Admitting: Family Medicine

## 2022-07-29 DIAGNOSIS — F5102 Adjustment insomnia: Secondary | ICD-10-CM

## 2022-07-29 MED ORDER — ESZOPICLONE 1 MG PO TABS: 1.0000 mg | ORAL_TABLET | Freq: Every evening | ORAL | 2 refills | Status: DC | PRN

## 2022-07-29 NOTE — Telephone Encounter (Signed)
Patient request refill on Lunesta and had an appointment on 1213/23.

## 2022-07-31 ENCOUNTER — Encounter: Payer: Self-pay | Admitting: Family Medicine

## 2022-07-31 ENCOUNTER — Other Ambulatory Visit: Payer: Self-pay

## 2022-07-31 ENCOUNTER — Ambulatory Visit: Payer: 59 | Admitting: Family Medicine

## 2022-07-31 ENCOUNTER — Ambulatory Visit (AMBULATORY_SURGERY_CENTER): Payer: Self-pay | Admitting: *Deleted

## 2022-07-31 VITALS — Ht 72.0 in | Wt 185.0 lb

## 2022-07-31 VITALS — BP 124/80 | HR 69 | Temp 98.1°F | Ht 72.0 in | Wt 185.8 lb

## 2022-07-31 DIAGNOSIS — Z1159 Encounter for screening for other viral diseases: Secondary | ICD-10-CM | POA: Diagnosis not present

## 2022-07-31 DIAGNOSIS — I4891 Unspecified atrial fibrillation: Secondary | ICD-10-CM

## 2022-07-31 DIAGNOSIS — R112 Nausea with vomiting, unspecified: Secondary | ICD-10-CM

## 2022-07-31 DIAGNOSIS — K219 Gastro-esophageal reflux disease without esophagitis: Secondary | ICD-10-CM

## 2022-07-31 DIAGNOSIS — I10 Essential (primary) hypertension: Secondary | ICD-10-CM | POA: Diagnosis not present

## 2022-07-31 DIAGNOSIS — Z0000 Encounter for general adult medical examination without abnormal findings: Secondary | ICD-10-CM | POA: Diagnosis not present

## 2022-07-31 DIAGNOSIS — Z23 Encounter for immunization: Secondary | ICD-10-CM | POA: Diagnosis not present

## 2022-07-31 DIAGNOSIS — Z8546 Personal history of malignant neoplasm of prostate: Secondary | ICD-10-CM | POA: Diagnosis not present

## 2022-07-31 DIAGNOSIS — K920 Hematemesis: Secondary | ICD-10-CM

## 2022-07-31 DIAGNOSIS — R933 Abnormal findings on diagnostic imaging of other parts of digestive tract: Secondary | ICD-10-CM

## 2022-07-31 LAB — BASIC METABOLIC PANEL
BUN: 20 mg/dL (ref 6–23)
CO2: 31 mEq/L (ref 19–32)
Calcium: 9.4 mg/dL (ref 8.4–10.5)
Chloride: 98 mEq/L (ref 96–112)
Creatinine, Ser: 0.86 mg/dL (ref 0.40–1.50)
GFR: 93.08 mL/min (ref >60.00)
Glucose, Bld: 93 mg/dL (ref 70–99)
Potassium: 3.4 mEq/L — ABNORMAL LOW (ref 3.5–5.1)
Sodium: 137 mEq/L (ref 135–145)

## 2022-07-31 LAB — PSA: PSA: 0.4 ng/mL (ref 0.10–4.00)

## 2022-07-31 NOTE — Progress Notes (Signed)
Pre visit completed over telephone, instructions forwarded through [place].   No egg or soy allergy known to patient  No issues known to pt with past sedation with any surgeries or procedures Patient denies ever being told they had issues or difficulty with intubation  No FH of Malignant Hyperthermia Pt is not on diet pills Pt is not on  home 02  Pt is not on blood thinners  Pt denies issues with constipation  No A fib or A flutter  Pt instructed to use [email or web address] or GoodRx for a price reduction on prep

## 2022-07-31 NOTE — Progress Notes (Signed)
[facility] [address] [place] [address] Dept: [phone] Dept Fax: [phone]  Annual Physical Visit  Subjective:    Patient ID: Clarence Perez, male    DOB: 02/08/1961, 61 y.o..   MRN: [id number]  Chief Complaint  Patient presents with   Annual Exam    CPE/labs.   No concerns.  Not fasting.      History of Present Illness:  Patient is in today for an annual physical/preventative visit.  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 His current regimen includes losartan 100 mg carvedilol 12.5 mg bid, chlorthalidone 25 mg daily and amiloride 5 mg daily. The amiloride was added to counter act some hypokalemia issues he had experienced with the chlorthalidone.  Review of Systems  Constitutional:  Negative for chills, diaphoresis, fever, malaise/fatigue and weight loss.  HENT:  Positive for congestion. Negative for ear discharge, hearing loss, sore throat and tinnitus.        Has chronic rhinosinusitis issues. Dr. Vilinda Flake has discussed with ENT about this. There are plans for a possible nasal surgery for septoplasty and sinus surgery.  Eyes:  Positive for pain, discharge and redness. Negative for blurred vision.       Has a chronic eye irritation/blepharitis. Working with ophthalmology. OS>OD.  Respiratory:  Negative for cough, shortness of breath and wheezing.   Cardiovascular:  Negative for chest pain, palpitations and leg swelling.       History of a. fib. S/p ablation. Seems to be maintaining a NSR.  Gastrointestinal:  Positive for heartburn. Negative for abdominal pain, constipation, diarrhea, nausea and vomiting.       Plans for EGD.  Musculoskeletal:  Negative for back pain, joint pain, myalgias and neck pain.  Skin:  Negative for rash.   Past Medical History: Patient Active Problem List   Diagnosis Date Noted   Hypokalemia 06/26/2022   Left anterior knee pain 06/26/2022   Mild intermittent asthma  12/11/2021   Adjustment insomnia 10/30/2021   Allergic rhinitis 09/29/2021   Atrial fibrillation with rapid ventricular response ([place]) 06/25/2021   Grade I diastolic dysfunction 00/17/4944   Hyperglycemia    Essential hypertension 11/04/2018   Gastro-esophageal reflux disease with esophagitis 12/04/2012   History of prostate cancer 06/02/2012   Obstructive sleep apnea 09/20/2011   Past Surgical History:  Procedure Laterality Date   COLONOSCOPY  09/2021   prostate cryoblation     Family History  Problem Relation Age of Onset   Cancer Mother        colon   Hypertension Mother    Cancer Father        prostate and kidney   CAD Maternal Uncle    Heart disease Paternal Uncle    Cancer Maternal Grandmother        Kidney   Outpatient Medications Prior to Visit  Medication Sig Dispense Refill   albuterol (VENTOLIN HFA) 108 (90 Base) MCG/ACT inhaler Inhale 2 puffs into the lungs every 4 (four) hours as needed for shortness of breath. 8 g 1   aMILoride (MIDAMOR) 5 MG tablet Take 1 tablet (5 mg total) by mouth daily. 30 tablet 5   azelastine (ASTELIN) 0.1 % nasal spray USE 2 SPRAYS IN EACH NOSTRIL TWICE DAILY AS DIRECTED (Patient taking differently: Place 2 sprays into both nostrils 2 (two) times daily.) 90 mL 3   chlorthalidone (HYGROTON) 25 MG tablet Take 1 tablet (25 mg total) by mouth daily. 90 tablet 3   diclofenac Sodium (VOLTAREN)  1 % GEL Apply 2 g topically as needed (sholduer pain). 300 g 1   ELIQUIS 5 MG TABS tablet TAKE 1 TABLET BY MOUTH TWICE  DAILY 180 tablet 3   EPINEPHrine 0.3 mg/0.3 mL IJ SOAJ injection Inject into the muscle.     eszopiclone (LUNESTA) 1 MG TABS tablet Take 1 tablet (1 mg total) by mouth at bedtime as needed for sleep. Take immediately before bedtime 30 tablet 2   fluticasone (FLONASE) 50 MCG/ACT nasal spray Place 1 spray into both nostrils in the morning and at bedtime.     losartan (COZAAR) 100 MG tablet TAKE 1 TABLET BY MOUTH DAILY 90 tablet 3    montelukast (SINGULAIR) 10 MG tablet Take 1 tablet (10 mg total) by mouth at bedtime. 30 tablet 3   omeprazole (PRILOSEC) 40 MG capsule TAKE 1 CAPSULE BY MOUTH DAILY (Patient taking differently: Take 40 mg by mouth daily.) 90 capsule 3   ondansetron (ZOFRAN-ODT) 4 MG disintegrating tablet Take 1 tablet (4 mg total) by mouth every 8 (eight) hours as needed for nausea or vomiting. 20 tablet 0   oxymetazoline (NASAL RELIEF) 0.05 % nasal spray Place into the nose.     potassium chloride SA (KLOR-CON M) 20 MEQ tablet Take 2 tablets (40 mEq total) by mouth daily. 180 tablet 3   White Petrolatum-Mineral Oil (ARTIFICIAL TEARS) OINT ophthalmic ointment Apply to eye.     carvedilol (COREG) 12.5 MG tablet TAKE 1 TABLET BY MOUTH TWICE  DAILY WITH A MEAL (Patient taking differently: Take 12.5 mg by mouth 2 (two) times daily with a meal.) 60 tablet 0   chlorthalidone (HYGROTON) 25 MG tablet Take 12.5 mg by mouth every morning.     levocetirizine (XYZAL) 5 MG tablet Take 1 tablet (5 mg total) by mouth every evening. (Patient not taking: Reported on 07/31/2022)     No facility-administered medications prior to visit.   Allergies  Allergen Reactions   Ace Inhibitors Cough    Objective:   Today's Vitals   07/31/22 1307  BP: 124/80  Pulse: 69  Temp: 98.1 F (36.7 C)  SpO2: 99%  Weight: 185 lb 12.8 oz (84.3 kg)  Height: 6' (1.829 m)   Body mass index is 25.2 kg/m.   General: Well developed, well nourished. No acute distress. HEENT: Normocephalic, non-traumatic. PERRL, EOMI. Conjunctiva are red, OS>OD. There is some mild   mattering of the eye.  External ears normal. EAC and TMs normal bilaterally. Nose    with mild congestion and rhinorrhea. No pain on palpation over the sinuses. Mucous membranes moist.   There are some whitish mucous   exudates on the posterior oropharynx. Good dentition. Neck: Supple. No lymphadenopathy. No thyromegaly. Lungs: Clear to auscultation bilaterally. No wheezing, rales  or rhonchi. CV: RRR without murmurs or rubs. Pulses 2+ bilaterally. Abdomen: Soft, non-tender. Bowel sounds positive, normal pitch and frequency. No hepatosplenomegaly.   No rebound or guarding. Back: Straight. No CVA tenderness bilaterally. Extremities: Full ROM. No joint swelling or tenderness. No edema noted. Skin: Warm and dry. No rashes. Psych: Alert and oriented. Normal mood and affect.  Health Maintenance Due  Topic Date Due   HIV Screening  Never done   Hepatitis C Screening  Never done   DTaP/Tdap/Td (2 - Td or Tdap) 07/21/2019   Zoster Vaccines- Shingrix (2 of 2) 05/07/2022     Assessment & Plan:   1. Annual physical exam Overall health is good. We discussed recommended screenings and immunizations. Continue regular exercise.  2. Encounter for hepatitis C screening test for low risk patient  - HCV Ab w Reflex to Quant PCR  3. Essential hypertension Blood pressure is at goal. I will recheck the potassium level today.  - Basic metabolic panel  4. Atrial fibrillation with rapid ventricular response ([facility]) Stable s/p ablation. Has upcoming appointment with cardiology.  5. History of prostate cancer Will reassess PSA.  - PSA  6. Need for zoster vaccination  - Varicella-zoster vaccine subcutaneous  Return in about 3 months (around 10/30/2022) for Reassessment.   Haydee Salter, MD

## 2022-08-01 LAB — HCV INTERPRETATION

## 2022-08-01 LAB — HCV AB W REFLEX TO QUANT PCR: HCV Ab: NONREACTIVE

## 2022-08-14 ENCOUNTER — Encounter: Payer: Self-pay | Admitting: Gastroenterology

## 2022-08-15 ENCOUNTER — Encounter: Payer: Self-pay | Admitting: Gastroenterology

## 2022-08-21 ENCOUNTER — Encounter: Payer: Self-pay | Admitting: Certified Registered Nurse Anesthetist

## 2022-08-23 ENCOUNTER — Ambulatory Visit (AMBULATORY_SURGERY_CENTER): Payer: 59 | Admitting: Gastroenterology

## 2022-08-23 ENCOUNTER — Encounter: Payer: Self-pay | Admitting: Gastroenterology

## 2022-08-23 ENCOUNTER — Encounter: Payer: 59 | Admitting: Gastroenterology

## 2022-08-23 VITALS — BP 104/63 | HR 63 | Temp 98.6°F | Resp 12 | Ht 72.0 in | Wt 185.0 lb

## 2022-08-23 DIAGNOSIS — R112 Nausea with vomiting, unspecified: Secondary | ICD-10-CM

## 2022-08-23 DIAGNOSIS — K219 Gastro-esophageal reflux disease without esophagitis: Secondary | ICD-10-CM

## 2022-08-23 DIAGNOSIS — R933 Abnormal findings on diagnostic imaging of other parts of digestive tract: Secondary | ICD-10-CM

## 2022-08-23 DIAGNOSIS — K319 Disease of stomach and duodenum, unspecified: Secondary | ICD-10-CM

## 2022-08-23 DIAGNOSIS — K2970 Gastritis, unspecified, without bleeding: Secondary | ICD-10-CM

## 2022-08-23 MED ORDER — SODIUM CHLORIDE 0.9 % IV SOLN: 500.0000 mL | Freq: Once | INTRAVENOUS | Status: DC

## 2022-08-23 NOTE — Patient Instructions (Signed)
Thank you for coming in to see [place] today! Resume previous diet and mediations today.  Continue with Prilosec 40 mg by mouth daily. Resume ELIQUIS at prior dose today. Return to regular daily activities tomorrow.  YOU HAD AN ENDOSCOPIC PROCEDURE TODAY AT [facility]:   Refer to the procedure report that was given to you for any specific questions about what was found during the examination.  If the procedure report does not answer your questions, please call your gastroenterologist to clarify.  If you requested that your care partner not be given the details of your procedure findings, then the procedure report has been included in a sealed envelope for you to review at your convenience later.  YOU SHOULD EXPECT: Some feelings of bloating in the abdomen. Passage of more gas than usual.  Walking can help get rid of the air that was put into your GI tract during the procedure and reduce the bloating. If you had a lower endoscopy (such as a colonoscopy or flexible sigmoidoscopy) you may notice spotting of blood in your stool or on the toilet paper. If you underwent a bowel prep for your procedure, you may not have a normal bowel movement for a few days.  Please Note:  You might notice some irritation and congestion in your nose or some drainage.  This is from the oxygen used during your procedure.  There is no need for concern and it should clear up in a day or so.  SYMPTOMS TO REPORT IMMEDIATELY:   Following upper endoscopy (EGD)  Vomiting of blood or coffee ground material  New chest pain or pain under the shoulder blades  Painful or persistently difficult swallowing  New shortness of breath  Fever of 100F or higher  Black, tarry-looking stools  For urgent or emergent issues, a gastroenterologist can be reached at any hour by calling [phone]. Do not use MyChart messaging for urgent concerns.    DIET:  We do recommend a small meal at first, but then you may proceed to  your regular diet.  Drink plenty of fluids but you should avoid alcoholic beverages for 24 hours.  ACTIVITY:  You should plan to take it easy for the rest of today and you should NOT DRIVE or use heavy machinery until tomorrow (because of the sedation medicines used during the test).    FOLLOW UP: Our staff will call the number listed on your records the next business day following your procedure.  We will call around 7:15- 8:00 am to check on you and address any questions or concerns that you may have regarding the information given to you following your procedure. If we do not reach you, we will leave a message.     If any biopsies were taken you will be contacted by phone or by letter within the next 1-3 weeks.  Please call us at [phone] if you have not heard about the biopsies in 3 weeks.    SIGNATURES/CONFIDENTIALITY: You and/or your care partner have signed paperwork which will be entered into your electronic medical record.  These signatures attest to the fact that that the information above on your After Visit Summary has been reviewed and is understood.  Full responsibility of the confidentiality of this discharge information lies with you and/or your care-partner.

## 2022-08-23 NOTE — Progress Notes (Signed)
1022 Robinul 0.1 mg IV given due large amount of secretions upon assessment.  MD made aware, vss 

## 2022-08-23 NOTE — Progress Notes (Signed)
[facility] History and Physical   Primary Care Physician:  Haydee Salter, MD   Reason for Procedure:  Nausea, vomiting, hematemesis  Plan:    EGD  with possible interventions as needed     HPI: Clarence Perez is a very pleasant 62 y.o. male here for evaluation of persistent nausea, vomiting and episode hematemesis in Nov 2023, he is on chronic anticoagulation (Eliquis).   The risks and benefits as well as alternatives of endoscopic procedure(s) have been discussed and reviewed. All questions answered. The patient agrees to proceed.    Past Medical History:  Diagnosis Date   Asthma    Cancer [facility])    prostate    GERD (gastroesophageal reflux disease)    Hypertension    Sleep apnea     Past Surgical History:  Procedure Laterality Date   COLONOSCOPY  09/2021   prostate cryoblation      Prior to Admission medications   Medication Sig Start Date End Date Taking? Authorizing Provider  aMILoride (MIDAMOR) 5 MG tablet Take 1 tablet (5 mg total) by mouth daily. 05/28/22  Yes Haydee Salter, MD  azelastine (ASTELIN) 0.1 % nasal spray USE 2 SPRAYS IN EACH NOSTRIL TWICE DAILY AS DIRECTED Patient taking differently: Place 2 sprays into both nostrils 2 (two) times daily. 02/16/21  Yes Cirigliano, Mary K, DO  eszopiclone (LUNESTA) 1 MG TABS tablet Take 1 tablet (1 mg total) by mouth at bedtime as needed for sleep. Take immediately before bedtime 07/29/22  Yes Haydee Salter, MD  fluticasone [employer]) 50 MCG/ACT nasal spray Place 1 spray into both nostrils in the morning and at bedtime.   Yes [provider]  losartan (COZAAR) 100 MG tablet TAKE 1 TABLET BY MOUTH DAILY 05/02/22  Yes Haydee Salter, MD  montelukast (SINGULAIR) 10 MG tablet Take 1 tablet (10 mg total) by mouth at bedtime. 09/29/21  Yes Nche, Charlene Brooke, NP  omeprazole (PRILOSEC) 40 MG capsule TAKE 1 CAPSULE BY MOUTH DAILY Patient taking differently: Take 40 mg by mouth daily. 12/18/21  Yes Haydee Salter, MD  oxymetazoline (NASAL RELIEF) 0.05 % nasal spray Place into the nose.   Yes [provider]  potassium chloride SA (KLOR-CON M) 20 MEQ tablet Take 2 tablets (40 mEq total) by mouth daily. 05/10/22  Yes Haydee Salter, MD  White Petrolatum-Mineral Oil (ARTIFICIAL TEARS) OINT ophthalmic ointment Apply to eye.   Yes [provider]  albuterol (VENTOLIN HFA) 108 (90 Base) MCG/ACT inhaler Inhale 2 puffs into the lungs every 4 (four) hours as needed for shortness of breath. 12/11/21   Haydee Salter, MD  amoxicillin-clavulanate (AUGMENTIN) 875-125 MG tablet Take by mouth. 08/14/22 09/04/22  [provider]  diclofenac Sodium (VOLTAREN) 1 % GEL Apply 2 g topically as needed (sholduer pain). 03/22/22   Haydee Salter, MD  ELIQUIS 5 MG TABS tablet TAKE 1 TABLET BY MOUTH TWICE  DAILY 01/28/22   [name], [name], PA  EPINEPHrine 0.3 mg/0.3 mL IJ SOAJ injection Inject into the muscle. Patient not taking: Reported on 08/23/2022 09/18/21   [provider]  ondansetron (ZOFRAN-ODT) 4 MG disintegrating tablet Take 1 tablet (4 mg total) by mouth every 8 (eight) hours as needed for nausea or vomiting. Patient not taking: Reported on 08/23/2022 05/23/22   Horton, Barbette Hair, MD  predniSONE (DELTASONE) 10 MG tablet Take by mouth. 08/14/22 09/03/22  [provider]    Current Outpatient Medications  Medication Sig Dispense Refill  aMILoride (MIDAMOR) 5 MG tablet Take 1 tablet (5 mg total) by mouth daily. 30 tablet 5   azelastine (ASTELIN) 0.1 % nasal spray USE 2 SPRAYS IN EACH NOSTRIL TWICE DAILY AS DIRECTED (Patient taking differently: Place 2 sprays into both nostrils 2 (two) times daily.) 90 mL 3   eszopiclone (LUNESTA) 1 MG TABS tablet Take 1 tablet (1 mg total) by mouth at bedtime as needed for sleep. Take immediately before bedtime 30 tablet 2   fluticasone (FLONASE) 50 MCG/ACT nasal spray Place 1 spray into both nostrils in the morning and at bedtime.      losartan (COZAAR) 100 MG tablet TAKE 1 TABLET BY MOUTH DAILY 90 tablet 3   montelukast (SINGULAIR) 10 MG tablet Take 1 tablet (10 mg total) by mouth at bedtime. 30 tablet 3   omeprazole (PRILOSEC) 40 MG capsule TAKE 1 CAPSULE BY MOUTH DAILY (Patient taking differently: Take 40 mg by mouth daily.) 90 capsule 3   oxymetazoline (NASAL RELIEF) 0.05 % nasal spray Place into the nose.     potassium chloride SA (KLOR-CON M) 20 MEQ tablet Take 2 tablets (40 mEq total) by mouth daily. 180 tablet 3   White Petrolatum-Mineral Oil (ARTIFICIAL TEARS) OINT ophthalmic ointment Apply to eye.     albuterol (VENTOLIN HFA) 108 (90 Base) MCG/ACT inhaler Inhale 2 puffs into the lungs every 4 (four) hours as needed for shortness of breath. 8 g 1   amoxicillin-clavulanate (AUGMENTIN) 875-125 MG tablet Take by mouth.     diclofenac Sodium (VOLTAREN) 1 % GEL Apply 2 g topically as needed (sholduer pain). 300 g 1   ELIQUIS 5 MG TABS tablet TAKE 1 TABLET BY MOUTH TWICE  DAILY 180 tablet 3   EPINEPHrine 0.3 mg/0.3 mL IJ SOAJ injection Inject into the muscle. (Patient not taking: Reported on 08/23/2022)     ondansetron (ZOFRAN-ODT) 4 MG disintegrating tablet Take 1 tablet (4 mg total) by mouth every 8 (eight) hours as needed for nausea or vomiting. (Patient not taking: Reported on 08/23/2022) 20 tablet 0   predniSONE (DELTASONE) 10 MG tablet Take by mouth.     Current Facility-Administered Medications  Medication Dose Route Frequency Provider Last Rate Last Admin   0.9 %  sodium chloride infusion  500 mL Intravenous Once Mauri Pole, MD        Allergies as of 08/23/2022 - Review Complete 08/23/2022  Allergen Reaction Noted   Ace inhibitors Cough 04/08/2012    Family History  Problem Relation Age of Onset   Colon cancer Mother    Cancer Mother        colon   Hypertension Mother    Cancer Father        prostate and kidney   CAD Maternal Uncle    Heart disease Paternal Uncle    Cancer Maternal Grandmother         Kidney   Rectal cancer Neg Hx    Stomach cancer Neg Hx    Esophageal cancer Neg Hx     Social History   Socioeconomic History   Marital status: Married    Spouse name: Not on file   Number of children: 0   Years of education: Not on file   Highest education level: Professional school degree (e.g., MD, DDS, DVM, JD)  Occupational History   Not on file  Tobacco Use   Smoking status: Never   Smokeless tobacco: Never  Vaping Use   Vaping Use: Never used  Substance and Sexual Activity  Alcohol use: Yes    Alcohol/week: 7.0 standard drinks of alcohol    Types: 7 Cans of beer per week    Comment: 1 beer daily 07/02/21   Drug use: Never   Sexual activity: Yes  Other Topics Concern   Not on file  Social History Narrative   Not on file   Social Determinants of Health   Financial Resource Strain: Not on file  Food Insecurity: Not on file  Transportation Needs: Not on file  Physical Activity: Not on file  Stress: Not on file  Social Connections: Not on file  Intimate Partner Violence: Not on file    Review of Systems:  All other review of systems negative except as mentioned in the HPI.  Physical Exam: Vital signs in last 24 hours: Blood Pressure 134/83   Pulse 76   Temperature 98.6 F (37 C) (Skin)   Respiration 12   Height 6' (1.829 m)   Weight 185 lb (83.9 kg)   Oxygen Saturation 99%   Body Mass Index 25.09 kg/m  General:   Alert, NAD Lungs:  Clear .   Heart:  Regular rate and rhythm Abdomen:  Soft, nontender and nondistended. Neuro/Psych:  Alert and cooperative. Normal mood and affect. A and O x 3  Reviewed labs, radiology imaging, old records and pertinent past GI work up  Patient is appropriate for planned procedure(s) and anesthesia in an ambulatory setting   K. Denzil Magnuson , MD [phone]

## 2022-08-23 NOTE — Progress Notes (Signed)
Pt's states no medical or surgical changes since previsit or office visit. VS assessed by D.T 

## 2022-08-23 NOTE — Progress Notes (Signed)
Report given to PACU, vss 

## 2022-08-23 NOTE — Progress Notes (Signed)
Called to room to assist during endoscopic procedure.  Patient ID and intended procedure confirmed with present staff. Received instructions for my participation in the procedure from the performing physician.  

## 2022-08-23 NOTE — Op Note (Signed)
[place] Patient Name: Clarence Perez Procedure Date: 08/23/2022 10:19 AM MRN: [id number] Endoscopist: Mauri Pole , MD, [id number] Age: 62 Referring MD:  Date of Birth: 1961/06/28 Gender: Male Account #: [id number] Procedure:                Upper GI endoscopy Indications:              Recent gastrointestinal bleeding, Nausea with                            vomiting Medicines:                Monitored Anesthesia Care Procedure:                Pre-Anesthesia Assessment:                           - Prior to the procedure, a History and Physical                            was performed, and patient medications and                            allergies were reviewed. The patient's tolerance of                            previous anesthesia was also reviewed. The risks                            and benefits of the procedure and the sedation                            options and risks were discussed with the patient.                            All questions were answered, and informed consent                            was obtained. Prior Anticoagulants: The patient                            last took Eliquis (apixaban) 2 days prior to the                            procedure. ASA Grade Assessment: II - A patient                            with mild systemic disease. After reviewing the                            risks and benefits, the patient was deemed in                            satisfactory condition to undergo the procedure.  After obtaining informed consent, the endoscope was                            passed under direct vision. Throughout the                            procedure, the patient's blood pressure, pulse, and                            oxygen saturations were monitored continuously. The                            GIF [id number] #[id number] was introduced through the                            mouth, and advanced to the second  part of duodenum.                            The upper GI endoscopy was accomplished without                            difficulty. The patient tolerated the procedure                            well. Scope In: Scope Out: Findings:                 The Z-line was regular and was found 38 cm from the                            incisors.                           No gross lesions were noted in the entire esophagus.                           The gastroesophageal flap valve was visualized                            endoscopically and classified as Hill Grade II                            (fold present, opens with respiration).                           Patchy mild inflammation characterized by                            congestion (edema), erythema and mucus was found in                            the entire examined stomach. Biopsies were taken                            with a cold forceps for Helicobacter pylori  testing.                           The cardia and gastric fundus were normal on                            retroflexion.                           The examined duodenum was normal. Complications:            No immediate complications. Estimated Blood Loss:     Estimated blood loss was minimal. Impression:               - Z-line regular, 38 cm from the incisors.                           - No gross lesions in the entire esophagus.                           - Gastroesophageal flap valve classified as Hill                            Grade II (fold present, opens with respiration).                           - Gastritis. Biopsied.                           - Normal examined duodenum. Recommendation:           - Patient has a contact number available for                            emergencies. The signs and symptoms of potential                            delayed complications were discussed with the                            patient. Return to normal activities tomorrow.                             Written discharge instructions were provided to the                            patient.                           - Resume previous diet.                           - Continue present medications.                           - Await pathology results.                           -  Follow an antireflux regimen.                           - Use Prilosec (omeprazole) 40 mg PO daily.                           - Resume Eliquis (apixaban) at prior dose today.                            Refer to managing physician for further adjustment                            of therapy. Mauri Pole, MD 08/23/2022 10:48:49 AM This report has been signed electronically.

## 2022-08-26 ENCOUNTER — Telehealth: Payer: Self-pay

## 2022-08-26 NOTE — Telephone Encounter (Signed)
  Follow up Call-     08/23/2022    9:20 AM  Call back number  Post procedure Call Back phone  # [phone]  Permission to leave phone message Yes     Patient questions:  Do you have a fever, pain , or abdominal swelling? No. Pain Score  0 *  Have you tolerated food without any problems? Yes.    Have you been able to return to your normal activities? Yes.    Do you have any questions about your discharge instructions: Diet   No. Medications  No. Follow up visit  No.  Do you have questions or concerns about your Care? No.  Actions: * If pain score is 4 or above: No action needed, pain <4.

## 2022-08-28 ENCOUNTER — Encounter: Payer: Self-pay | Admitting: Cardiovascular Disease

## 2022-08-29 ENCOUNTER — Encounter: Payer: Self-pay | Admitting: Gastroenterology

## 2022-09-07 ENCOUNTER — Encounter: Payer: Self-pay | Admitting: Family Medicine

## 2022-09-07 DIAGNOSIS — I10 Essential (primary) hypertension: Secondary | ICD-10-CM

## 2022-09-09 MED ORDER — AMILORIDE HCL 5 MG PO TABS: 5.0000 mg | ORAL_TABLET | Freq: Every day | ORAL | 2 refills | Status: DC

## 2022-09-18 ENCOUNTER — Encounter: Payer: Self-pay | Admitting: Family Medicine

## 2022-09-18 DIAGNOSIS — E876 Hypokalemia: Secondary | ICD-10-CM

## 2022-09-25 ENCOUNTER — Other Ambulatory Visit (INDEPENDENT_AMBULATORY_CARE_PROVIDER_SITE_OTHER): Payer: 59

## 2022-09-25 DIAGNOSIS — E876 Hypokalemia: Secondary | ICD-10-CM

## 2022-09-26 ENCOUNTER — Encounter (HOSPITAL_COMMUNITY): Payer: Self-pay | Admitting: *Deleted

## 2022-09-26 LAB — POTASSIUM: Potassium: 3.5 mEq/L (ref 3.5–5.1)

## 2022-09-28 ENCOUNTER — Encounter: Payer: Self-pay | Admitting: Family Medicine

## 2022-09-28 DIAGNOSIS — E876 Hypokalemia: Secondary | ICD-10-CM

## 2022-09-30 MED ORDER — POTASSIUM CHLORIDE CRYS ER 20 MEQ PO TBCR: 40.0000 meq | EXTENDED_RELEASE_TABLET | Freq: Every day | ORAL | 3 refills | Status: DC

## 2022-11-06 ENCOUNTER — Encounter: Payer: Self-pay | Admitting: Family Medicine

## 2022-11-06 ENCOUNTER — Ambulatory Visit: Payer: 59 | Admitting: Family Medicine

## 2022-11-06 VITALS — BP 118/66 | HR 73 | Temp 98.3°F | Ht 72.0 in | Wt 190.8 lb

## 2022-11-06 DIAGNOSIS — I10 Essential (primary) hypertension: Secondary | ICD-10-CM | POA: Diagnosis not present

## 2022-11-06 DIAGNOSIS — J342 Deviated nasal septum: Secondary | ICD-10-CM

## 2022-11-06 DIAGNOSIS — E876 Hypokalemia: Secondary | ICD-10-CM

## 2022-11-06 NOTE — Assessment & Plan Note (Signed)
Blood pressure is in good control. Continue osartan 100 mg daily. HCTZ 25 mg daily, and amiloride 5 mg daily. Dr. Vilinda Flake asked about secondary causes of hypertension, esp. primary hyperaldosteronism. He does have risk factors for this with refractory hypertension and hypokalemia. I will order a aldosterone and renin activity to assess. I will also check his potassium to make sure he is maintaining his potassium at present.

## 2022-11-06 NOTE — Progress Notes (Signed)
[facility] [address] [place] [address] Dept: [phone] Dept Fax: [phone]  Chronic Care Office Visit  Subjective:    Patient ID: Clarence Perez, male    DOB: 1960/08/30, 62 y.o..   MRN: [id number]  Chief Complaint  Patient presents with   Medical Management of Chronic Issues    3 month f/u.  No concerns.      History of Present Illness:  Patient is in today for reassessment of chronic medical issues.  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 He is currently managed on losartan 100 mg daily. HCTZ 25 mg daily, and amiloride 5 mg daily. The amiloride had been managed to try and improve the persistent hypokalemia issues that had developed. He is tolerating his medicaitons well at this point.  Dr. Vilinda Flake  has a history of chronic rhinitis. In Feb., he was seen by Dr. Loanne Drilling (ENT). He was found to have a pansinusitis on the right. Dr. Loanne Drilling is planning a surgical procedure related to a deviated septum to try and relieve the issue.  Past Medical History: Patient Active Problem List   Diagnosis Date Noted   Hypokalemia 06/26/2022   Left anterior knee pain 06/26/2022   Mild intermittent asthma 12/11/2021   Adjustment insomnia 10/30/2021   Allergic rhinitis 09/29/2021   Rhinitis, chronic 07/24/2021   Nasal turbinate hypertrophy 07/24/2021   Deviated septum 07/24/2021   Atrial fibrillation with rapid ventricular response ([place]) 06/25/2021   Grade I diastolic dysfunction [id number]   Hyperglycemia    Essential hypertension 11/04/2018   Gastro-esophageal reflux disease with esophagitis 12/04/2012   History of prostate cancer 06/02/2012   Obstructive sleep apnea 09/20/2011   Past Surgical History:  Procedure Laterality Date   COLONOSCOPY  09/2021   prostate cryoblation     Family History  Problem Relation Age of Onset   Colon cancer Mother    Cancer Mother        colon   Hypertension Mother    Cancer  Father        prostate and kidney   CAD Maternal Uncle    Heart disease Paternal Uncle    Cancer Maternal Grandmother        Kidney   Rectal cancer Neg Hx    Stomach cancer Neg Hx    Esophageal cancer Neg Hx    Outpatient Medications Prior to Visit  Medication Sig Dispense Refill   albuterol (VENTOLIN HFA) 108 (90 Base) MCG/ACT inhaler Inhale 2 puffs into the lungs every 4 (four) hours as needed for shortness of breath. 8 g 1   aMILoride (MIDAMOR) 5 MG tablet Take 1 tablet (5 mg total) by mouth daily. 90 tablet 2   azelastine (ASTELIN) 0.1 % nasal spray USE 2 SPRAYS IN EACH NOSTRIL TWICE DAILY AS DIRECTED (Patient taking differently: Place 2 sprays into both nostrils 2 (two) times daily.) 90 mL 3   diclofenac Sodium (VOLTAREN) 1 % GEL Apply 2 g topically as needed (sholduer pain). 300 g 1   ELIQUIS 5 MG TABS tablet TAKE 1 TABLET BY MOUTH TWICE  DAILY 180 tablet 3   EPINEPHrine 0.3 mg/0.3 mL IJ SOAJ injection Inject into the muscle.     eszopiclone (LUNESTA) 1 MG TABS tablet Take 1 tablet (1 mg total) by mouth at bedtime as needed for sleep. Take immediately before bedtime 30 tablet 2   fluticasone (FLONASE) 50 MCG/ACT nasal spray Place 1 spray into both nostrils in the morning and at bedtime.  hydrochlorothiazide (HYDRODIURIL) 25 MG tablet Take 25 mg by mouth daily.     losartan (COZAAR) 100 MG tablet TAKE 1 TABLET BY MOUTH DAILY 90 tablet 3   montelukast (SINGULAIR) 10 MG tablet Take 1 tablet (10 mg total) by mouth at bedtime. 30 tablet 3   omeprazole (PRILOSEC) 40 MG capsule TAKE 1 CAPSULE BY MOUTH DAILY (Patient taking differently: Take 40 mg by mouth daily.) 90 capsule 3   oxymetazoline (NASAL RELIEF) 0.05 % nasal spray Place into the nose.     potassium chloride SA (KLOR-CON M) 20 MEQ tablet Take 2 tablets (40 mEq total) by mouth daily. 180 tablet 3   White Petrolatum-Mineral Oil (ARTIFICIAL TEARS) OINT ophthalmic ointment Apply to eye.     ondansetron (ZOFRAN-ODT) 4 MG  disintegrating tablet Take 1 tablet (4 mg total) by mouth every 8 (eight) hours as needed for nausea or vomiting. (Patient not taking: Reported on 08/23/2022) 20 tablet 0   No facility-administered medications prior to visit.   Allergies  Allergen Reactions   Ace Inhibitors Cough   Objective:   Today's Vitals   11/06/22 1306  BP: 118/66  Pulse: 73  Temp: 98.3 F (36.8 C)  TempSrc: Temporal  SpO2: 96%  Weight: 190 lb 12.8 oz (86.5 kg)  Height: 6' (1.829 m)   Body mass index is 25.88 kg/m.   General: Well developed, well nourished. No acute distress. Psych: Alert and oriented. Normal mood and affect.  Health Maintenance Due  Topic Date Due   HIV Screening  Never done   DTaP/Tdap/Td (2 - Td or Tdap) 07/21/2019     Assessment & Plan:   Problem List Items Addressed This Visit       Cardiovascular and Mediastinum   Essential hypertension - Primary    Blood pressure is in good control. Continue osartan 100 mg daily. HCTZ 25 mg daily, and amiloride 5 mg daily. Dr. Vilinda Flake asked about secondary causes of hypertension, esp. primary hyperaldosteronism. He does have risk factors for this with refractory hypertension and hypokalemia. I will order a aldosterone and renin activity to assess. I will also check his potassium to make sure he is maintaining his potassium at present.       Relevant Medications   hydrochlorothiazide (HYDRODIURIL) 25 MG tablet   Other Relevant Orders   Basic metabolic panel   Aldosterone + renin activity w/ ratio     Respiratory   Deviated septum     Other   Hypokalemia   Relevant Orders   Basic metabolic panel   Aldosterone + renin activity w/ ratio   Return in about 3 months (around 02/06/2023) for Reassessment.   Haydee Salter, MD

## 2022-11-12 ENCOUNTER — Telehealth: Payer: Self-pay | Admitting: Family Medicine

## 2022-11-12 NOTE — Telephone Encounter (Signed)
Pt has to do labs but we don't open up early enough for him to get labs done because of his work schedule. Pt want to know canhe go to another office and let them do his lab. Please call the pt and let him know

## 2022-11-12 NOTE — Telephone Encounter (Signed)
Looked up information and unable to find a location that opens before 8 am.  Will call him back in the morning to inform him. Dm/cma

## 2022-11-13 NOTE — Telephone Encounter (Signed)
Spoke to patient, he will come in and have the BMP done tomorrow. The other test will have to wait due to unable to do early labs.  Dm/cma

## 2022-11-14 ENCOUNTER — Other Ambulatory Visit: Payer: Self-pay

## 2022-11-14 ENCOUNTER — Telehealth: Payer: Self-pay

## 2022-11-14 ENCOUNTER — Other Ambulatory Visit (INDEPENDENT_AMBULATORY_CARE_PROVIDER_SITE_OTHER): Payer: 59

## 2022-11-14 DIAGNOSIS — I10 Essential (primary) hypertension: Secondary | ICD-10-CM

## 2022-11-14 DIAGNOSIS — E876 Hypokalemia: Secondary | ICD-10-CM

## 2022-11-14 LAB — BASIC METABOLIC PANEL
BUN: 11 mg/dL (ref 6–23)
CO2: 31 mEq/L (ref 19–32)
Calcium: 9.7 mg/dL (ref 8.4–10.5)
Chloride: 100 mEq/L (ref 96–112)
Creatinine, Ser: 0.91 mg/dL (ref 0.40–1.50)
GFR: 90.42 mL/min (ref >60.00)
Glucose, Bld: 103 mg/dL — ABNORMAL HIGH (ref 70–99)
Potassium: 4.4 mEq/L (ref 3.5–5.1)
Sodium: 138 mEq/L (ref 135–145)

## 2022-11-14 NOTE — Telephone Encounter (Signed)
FYI message below   patient in office today for labs per patient he does not want to have Aldosterone +renin test today due to time visit being afternoon appointment. Patient states that he will come back for a morning appointment to have testing. Only BMP was drawn today.

## 2022-11-18 ENCOUNTER — Other Ambulatory Visit: Payer: Self-pay | Admitting: Family Medicine

## 2022-11-18 NOTE — Telephone Encounter (Signed)
Duplicate message. 

## 2022-11-19 ENCOUNTER — Other Ambulatory Visit: Payer: Self-pay

## 2022-11-19 ENCOUNTER — Other Ambulatory Visit (HOSPITAL_COMMUNITY): Payer: Self-pay

## 2022-11-19 MED ORDER — HYDROCHLOROTHIAZIDE 25 MG PO TABS
25.0000 mg | ORAL_TABLET | Freq: Every day | ORAL | 2 refills | Status: DC
  Filled 2022-11-19: qty 90, 90d supply, fill #0
  Filled 2022-11-23: qty 30, 30d supply, fill #0

## 2022-11-23 ENCOUNTER — Other Ambulatory Visit (HOSPITAL_COMMUNITY): Payer: Self-pay

## 2022-12-02 ENCOUNTER — Ambulatory Visit: Payer: 59 | Attending: Cardiovascular Disease | Admitting: Cardiovascular Disease

## 2022-12-02 ENCOUNTER — Encounter: Payer: Self-pay | Admitting: Cardiovascular Disease

## 2022-12-02 VITALS — BP 138/78 | HR 70 | Ht 72.0 in | Wt 197.8 lb

## 2022-12-02 DIAGNOSIS — K219 Gastro-esophageal reflux disease without esophagitis: Secondary | ICD-10-CM

## 2022-12-02 DIAGNOSIS — G4733 Obstructive sleep apnea (adult) (pediatric): Secondary | ICD-10-CM | POA: Diagnosis not present

## 2022-12-02 DIAGNOSIS — I480 Paroxysmal atrial fibrillation: Secondary | ICD-10-CM | POA: Diagnosis not present

## 2022-12-02 DIAGNOSIS — I10 Essential (primary) hypertension: Secondary | ICD-10-CM

## 2022-12-02 DIAGNOSIS — Z7901 Long term (current) use of anticoagulants: Secondary | ICD-10-CM

## 2022-12-02 DIAGNOSIS — J3089 Other allergic rhinitis: Secondary | ICD-10-CM

## 2022-12-02 DIAGNOSIS — J342 Deviated nasal septum: Secondary | ICD-10-CM | POA: Diagnosis not present

## 2022-12-02 NOTE — Patient Instructions (Signed)
Medication Instructions:  Continue same medications   Lab Work: None ordered   Testing/Procedures: None ordered   Follow-Up: At [facility], you and your health needs are our priority.  As part of our continuing mission to provide you with exceptional heart care, we have created designated Provider Care Teams.  These Care Teams include your primary Cardiologist (physician) and Advanced Practice Providers (APPs -  Physician Assistants and Nurse Practitioners) who all work together to provide you with the care you need, when you need it.  We recommend signing up for the patient portal called "MyChart".  Sign up information is provided on this After Visit Summary.  MyChart is used to connect with patients for Virtual Visits (Telemedicine).  Patients are able to view lab/test results, encounter notes, upcoming appointments, etc.  Non-urgent messages can be sent to your provider as well.   To learn more about what you can do with MyChart, go to [email or web address].    Your next appointment:  1 year   Call in Jan to schedule April appointment     Provider:  Dr.Kelly  

## 2022-12-02 NOTE — Progress Notes (Signed)
Cardiology Office Note    Date:  12/08/2022   ID:  Clarence Perez, DOB Mar 15, 1961, MRN [id number]  PCP:  Loyola Mast, MD  Cardiologist:  Nicki Guadalajara, MD (sleep); Dr. Elberta Fortis  One year F/U sleep evaluation   History of Present Illness:  Clarence Perez is a 62 y.o. male whi is followed by Dr Elberta Fortis who has PAF and is scheduled to undergo a potential cardiac ablation in June 2023.   He as a history of hypertension for 15 years, GERD, prostate cancer and OSA. He had initially undergone a sleep study in 2010 and had severe OSA with AHI ` 50/h. He apparently tried CPAP for a short duration. Since 2016-2017 he has a recurrent palpitations and due to concern for untreated OSA he underwent a home sleep study on 08/18/2021. Severe OSA was again demonstrated with AHI 36.3/h and he developed significant O2 desaturation to a nadir of 78%.  He had called the office and requested he speak to me, and I had a lengthy discussion with him on the phone and reviewed hid study and in light of his potential ablation tried to expedite his CPAP set up date. Shortly thereafter he received a new ResMed AirSense 11 AutoSet CPAP unit on 10/05/2021. Previous to CPAP use he was snoring, had nocturia 2 times per night and was having nocturnal palpitation. Since CPAP initiation symptoms have improved. A download from 2/17 - 11/03/2021 demonstrates compliance with usage essentially every night with average use of 6 hours and 20 minutes.  His CPAP unit is set at a pressure range of 7 to 18 cm of water.  His 95th percentile pressure is 12.6 and maximum average pressure 14.3 cm and AHI is 3.8.  Average sleep duration was 6 hours and 20 minutes.  Typically, he goes to bed around 10:20 p.m. and wakes up around 6:20 AM.  He denies any residual daytime sleepiness.  An Epworth Sleepiness Scale score was calculated in the office today and this end will images not 70s or orsed at 7 arguing against residual sleepiness.  He had  questions concerning his potential tentatively scheduled atrial fibrillation ablation.  Presently, he is on carvedilol 12.5 mg twice a day, diltiazem CD1 180 mg daily, hydrochlorothiazide 25 mg in addition to losartan 100 mg daily for hypertension.  He is anticoagulated with Eliquis 5 mg twice a day.  He is on [place] which he takes as needed for sleep initiation and maintenance.  He is on omeprazole for GERD.  He takes albuterol on a as needed basis for mild asthma.    Since I last saw him, he tells me he underwent A-fib ablation at [facility] by Dr. Haynes Dage in the summer 2023.  He denies any awareness of recurrent A-fib.  He continues to be on amiloride 5 mg, hydrochlorothiazide 25 mg, losartan 100 mg for hypertension.  He is anticoagulated on Eliquis.  He takes montelukast and albuterol for asthma.  He has issues with deviated septum and hypertrophied turbinates.  He has seen Dr. Katherina Mires at [facility] and will be undergoing surgery tentatively scheduled for May 2024.  He continues to use CPAP for his obstructive sleep apnea.  He received a ResMed AirSense 11 AutoSet unit on October 05, 2021.  He typically goes to bed at 1030 and wakes up between 430 and 530.  A download was obtained from March 17 through December 02, 2022.  Compliance is excellent with average use at 6 hours and 8  minutes.  His pressure is set at a range of 7 to 18 cm of water.  AHI is 1.3.  His 95th percentile pressure is 13.2 with maximum average pressure of 15.0.  He presents for yearly evaluation.   Past Medical History:  Diagnosis Date   Asthma    Cancer    prostate    GERD (gastroesophageal reflux disease)    Hypertension    Sleep apnea     Past Surgical History:  Procedure Laterality Date   COLONOSCOPY  09/2021   prostate cryoblation      Current Medications: Outpatient Medications Prior to Visit  Medication Sig Dispense Refill   albuterol (VENTOLIN HFA) 108 (90 Base) MCG/ACT inhaler Inhale 2 puffs into the lungs every  4 (four) hours as needed for shortness of breath. 8 g 1   aMILoride (MIDAMOR) 5 MG tablet Take 1 tablet (5 mg total) by mouth daily. 90 tablet 2   azelastine (ASTELIN) 0.1 % nasal spray USE 2 SPRAYS IN EACH NOSTRIL TWICE DAILY AS DIRECTED (Patient taking differently: Place 2 sprays into both nostrils 2 (two) times daily.) 90 mL 3   diclofenac Sodium (VOLTAREN) 1 % GEL Apply 2 g topically as needed (sholduer pain). 300 g 1   ELIQUIS 5 MG TABS tablet TAKE 1 TABLET BY MOUTH TWICE  DAILY 180 tablet 3   EPINEPHrine 0.3 mg/0.3 mL IJ SOAJ injection Inject into the muscle.     eszopiclone (LUNESTA) 1 MG TABS tablet Take 1 tablet (1 mg total) by mouth at bedtime as needed for sleep. Take immediately before bedtime 30 tablet 2   fluticasone (FLONASE) 50 MCG/ACT nasal spray Place 1 spray into both nostrils in the morning and at bedtime.     hydrochlorothiazide (HYDRODIURIL) 25 MG tablet Take 1 tablet (25 mg total) by mouth daily. 90 tablet 2   losartan (COZAAR) 100 MG tablet TAKE 1 TABLET BY MOUTH DAILY 90 tablet 3   montelukast (SINGULAIR) 10 MG tablet Take 1 tablet (10 mg total) by mouth at bedtime. 30 tablet 3   omeprazole (PRILOSEC) 40 MG capsule TAKE 1 CAPSULE BY MOUTH DAILY (Patient taking differently: Take 40 mg by mouth daily.) 90 capsule 3   oxymetazoline (NASAL RELIEF) 0.05 % nasal spray Place into the nose.     potassium chloride SA (KLOR-CON M) 20 MEQ tablet Take 2 tablets (40 mEq total) by mouth daily. 180 tablet 3   White Petrolatum-Mineral Oil (ARTIFICIAL TEARS) OINT ophthalmic ointment Apply to eye.     No facility-administered medications prior to visit.     Allergies:   Ace inhibitors   Social History   Socioeconomic History   Marital status: Married    Spouse name: Not on file   Number of children: 0   Years of education: Not on file   Highest education level: Professional school degree (e.g., MD, DDS, DVM, JD)  Occupational History   Not on file  Tobacco Use   Smoking status:  Never   Smokeless tobacco: Never  Vaping Use   Vaping Use: Never used  Substance and Sexual Activity   Alcohol use: Yes    Alcohol/week: 7.0 standard drinks of alcohol    Types: 7 Cans of beer per week    Comment: 1 beer daily 07/02/21   Drug use: Never   Sexual activity: Yes  Other Topics Concern   Not on file  Social History Narrative   Not on file   Social Determinants of Health   Financial Resource  Strain: Not on file  Food Insecurity: Not on file  Transportation Needs: Not on file  Physical Activity: Not on file  Stress: Not on file  Social Connections: Not on file   Socially he was born in [place], [place]. 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   ROS General: Negative; No fevers, chills, or night sweats;  HEENT: Negative; No changes in vision or hearing, sinus congestion, difficulty swallowing Pulmonary: Allergies, mild asthma Cardiovascular: History of hypertension, PAF, GI: GERD GU: Negative; No dysuria, hematuria, or difficulty voiding Musculoskeletal: Negative; no myalgias, joint pain, or weakness Hematologic/Oncology:  Endocrine: Negative; no heat/cold intolerance; no diabetes Neuro: Negative; no changes in balance, headaches Skin: Negative; No rashes or skin lesions Psychiatric: Negative; No behavioral problems, depression Sleep: See HPI: Initially diagnosed with OSA in 2010.  Most recently rediagnosed December 2022.  Since initiating CPAP therapy improvement in snoring, previous nocturia, and nocturnal palpitations.  No bruxism, restless legs, hypnogognic hallucinations, no cataplexy  An Epworth Sleepiness Scale  score was calculated in the office today and this endorsed at 3 arguing against residual daytime sleepiness.  Other comprehensive 14 point system review is negative.   PHYSICAL EXAM:   VS:  BP 138/78   Pulse 70   Ht 6' (1.829 m)   Wt 197 lb 12.8 oz (89.7 kg)   SpO2 99%   BMI 26.83 kg/m     Repeat blood pressure by me 120/76  Wt Readings from Last 3 Encounters:  12/02/22 197 lb 12.8 oz (89.7 kg)  11/06/22 190 lb 12.8 oz (86.5 kg)  08/23/22 185 lb (83.9 kg)    General: Alert, oriented, no distress.  Skin: normal turgor, no rashes, warm and dry HEENT: Normocephalic, atraumatic. Pupils equal round and reactive to light; sclera anicteric; extraocular muscles intact;  Nose without nasal septal hypertrophy Mouth/Parynx benign; Mallinpatti scale 3/4 Neck: No JVD, no carotid bruits; normal carotid upstroke Lungs: clear to ausculatation and percussion; no wheezing or rales Chest wall: without tenderness to palpitation Heart: PMI not displaced, RRR, s1 s2 normal, 1/6 systolic murmur, no diastolic murmur, no rubs, gallops, thrills, or heaves Abdomen: soft, nontender; no hepatosplenomehaly, BS+; abdominal aorta nontender and not dilated by palpation. Back: no CVA tenderness Pulses 2+ Musculoskeletal: full range of motion, normal strength, no joint deformities Extremities: no clubbing cyanosis or edema, Homan's sign negative  Neurologic: grossly nonfocal; Cranial nerves grossly wnl Psychologic: Normal mood and affect      Studies/Labs Reviewed:   December 02, 2022 ECG (independently read by me): NSR at 70, mild T wave changes laterally   December 05, 2021 ECG (independently read by me): NSR at 66; nonspecific ST changes; QTc 366  Recent Labs:    Latest Ref Rng & Units 11/14/2022    1:45 PM 09/25/2022    4:04 PM 07/31/2022    1:42 PM  BMP  Glucose 70 - 99 mg/dL 161   93   BUN 6 - 23 mg/dL 11   20   Creatinine 0.96 - 1.50 mg/dL 0.45   4.09   Sodium 811 - 145 mEq/L 138   137    Potassium 3.5 - 5.1 mEq/L 4.4  3.5  3.4   Chloride 96 - 112 mEq/L 100   98   CO2 19 - 32 mEq/L 31   31   Calcium 8.4 - 10.5 mg/dL 9.7   9.4         Latest Ref Rng & Units 05/23/2022   12:56 AM 06/25/2021    4:09 AM 02/14/2021    2:35 PM  Hepatic Function  Total Protein 6.5 - 8.1 g/dL 7.8  7.8    Albumin 3.5 - 5.0 g/dL 4.3  4.3    AST 15 - 41 U/L 21  24  20    ALT 0 - 44 U/L 24  25  25    Alk Phosphatase 38 - 126 U/L 84  96    Total Bilirubin 0.3 - 1.2 mg/dL 0.7  0.6         Latest Ref Rng & Units 05/23/2022    4:47 AM 05/23/2022   12:56 AM 09/21/2021   10:03 PM  CBC  WBC 4.0 - 10.5 K/uL  14.2  7.4   Hemoglobin 13.0 - 17.0 g/dL 16.1  09.6  04.5   Hematocrit 39.0 - 52.0 % 39.9  43.3  43.2   Platelets 150 - 400 K/uL  275  249    Lab Results  Component Value Date   MCV 89.3 05/23/2022   MCV 87.1 09/21/2021   MCV 89.7 08/01/2021   Lab Results  Component Value Date   TSH 3.342 06/25/2021   Lab Results  Component Value Date   HGBA1C 5.9 (H) 06/25/2021     BNP No results found for: "BNP"  ProBNP No results found for: "PROBNP"   Lipid Panel     Component Value Date/Time   CHOL 186 06/25/2021 0409   TRIG 179 (H) 06/25/2021 0409   HDL 52 06/25/2021 0409   CHOLHDL 3.6 06/25/2021 0409   VLDL 36 06/25/2021 0409   LDLCALC 98 06/25/2021 0409     RADIOLOGY: No results found.   Additional studies/ records that were reviewed today include:  pal    ASSESSMENT:    1. OSA (obstructive sleep apnea)   2. Paroxysmal atrial fibrillation: s/p atrial fibrillation ablation at [facility]   3. Essential hypertension   4. Deviated septum   5. Non-seasonal allergic rhinitis due to other allergic trigger   6. Gastroesophageal reflux disease without esophagitis   7. 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  Apparently he only tried CPAP for short duration.  Since 2016/2017, he has had recurrent palpitations.  He has been diagnosed as having paroxysmal atrial fibrillation.  Presently, he has been maintaining sinus rhythm.  He underwent a follow-up sleep evaluation due to his AF which was a home study which again verified severe sleep apnea with an overall AHI of 36.3/h.  Sleep apnea was more severe with supine sleep with an AHI of 53.0.  He has significant oxygen desaturation to a nadir of 78% and time spent less than 89% was 29.3 minutes.  He had requested that I speak to him shortly after his study prior to his initial office evaluation.  At that time I reviewed his sleep study and in light of his potential upcoming ablation procedure, try to expedite his CPAP set up.  He has been on CPAP therapy since October 05, 2021 and is meeting compliance standards.  He has been using a full facemask.  On his initial download from February 17 through March 18 average sleep duration was 6 hours and 20 minutes, AHI was 3.8 and his 95th percentile pressure was 12.6 with maximum average of 14.3.  His most download from March 20 through December 04, 2021, 95th percentile pressure was 12.2 with maximum average pressure 14.1 cm and AHI 2.3.  Clinically he has noted significant improvement in his previous nocturnal palpitations with CPAP therapy.  At his initial sleep evaluation with me in April 2023 I had a long discussion with him regarding sleep apnea and its potential disruptive effects on normal sleep architecture as well as potential adverse cardiovascular consequences.  At that evaluation, he was meeting compliance and felt significantly improved with CPAP initiation.  Since my evaluation, he is now followed by EP at [facility].  He underwent an atrial fibrillation ablation in December 2023 by Dr. Haynes Dage.  He has issues with deviated septum and  turbinate hypertrophy for which she is seeing an ENT physician Dr. Katherina Mires at [facility] and is tentatively scheduled to undergo surgery for correction.  He has continued to use CPAP.  His most recent download confirms compliance although average sleep duration is still suboptimal at only 6 hours and 8 minutes with CPAP use.  His CPAP is set at a pressure range of 718 cm of water and AHI is 1.3.  His 95th percentile pressure 13.2 and maximum average pressure 15.0.  I suspect following his correction of his deviated septum and turbinate reduction, he will have significant improvement in his sleep apnea but I suspect he may still require therapy.  His blood pressure today is stable on current therapy with amiloride 5 mg, losartan 100 mg, and HCTZ 25 mg.  He is maintaining sinus rhythm and continues to be on Eliquis for anticoagulation.  He is on albuterol and montelukast doe allergy/asthma.  He takes omeprazole for GERD.  He is followed by Dr. Herbie Drape for primary care and is scheduled to see him in June for follow-up evaluation at which laboratory will be obtained.  I will see him in 1 year for follow-up evaluation or sooner as needed.   Medication Adjustments/Labs and Tests Ordered: Current medicines are reviewed at length with the patient today.  Concerns regarding medicines are outlined above.  Medication changes, Labs and Tests ordered today are listed in the Patient Instructions below. Patient Instructions  Medication Instructions:  Continue same medications   Lab Work: None ordered   Testing/Procedures: None ordered   Follow-Up: At [facility], you and your health needs are our priority.  As part of our continuing mission to provide you with exceptional heart care, we have created designated Provider Care Teams.  These Care Teams include your primary Cardiologist (physician) and Advanced Practice Providers (APPs -  Physician Assistants and Nurse Practitioners) who all work  together to provide you with the care you need, when you need it.  We recommend signing up for the patient portal called "MyChart".  Sign up information is provided on this After Visit Summary.  MyChart is used to connect with patients for Virtual Visits (Telemedicine).  Patients are able to view lab/test results, encounter notes, upcoming appointments, etc.  Non-urgent messages can be sent to your provider as well.   To learn more about what you can do with MyChart, go to [email or web address].  Your next appointment:  1 year   Call in Jan to schedule April appointment     Provider:  [facility]     Signed, Nicki Guadalajara, MD, [facility], ABSM Diplomate, American Board of Sleep Medicine   12/08/2022 5:33 PM    [facility] [address], [place], [place]  [address] Phone: [phone]

## 2022-12-08 ENCOUNTER — Encounter: Payer: Self-pay | Admitting: Family Medicine

## 2022-12-08 ENCOUNTER — Encounter: Payer: Self-pay | Admitting: Cardiovascular Disease

## 2022-12-08 DIAGNOSIS — F5102 Adjustment insomnia: Secondary | ICD-10-CM

## 2022-12-08 DIAGNOSIS — I10 Essential (primary) hypertension: Secondary | ICD-10-CM

## 2022-12-09 NOTE — Telephone Encounter (Signed)
Refill request for:  HCTZ  25 mg LR 11/19/22,  #90, 2 rf ([facility])  Losartan 100 mg LR  05/02/22, #90, 3 rf ([facility])  Amiloride 5 mg LR  09/09/22, #90,. 2 rf ([facility])  Eszopiclone 1 mg LR 07/29/22, #30, 2 rf (HT)  Patient wants all refills to go to [facility]. Please review and advise.  Thanks. Dm/cma

## 2022-12-10 MED ORDER — ESZOPICLONE 1 MG PO TABS
1.0000 mg | ORAL_TABLET | Freq: Every evening | ORAL | 2 refills | Status: DC | PRN
Start: 2022-12-10 — End: 2023-01-17

## 2022-12-10 MED ORDER — AMILORIDE HCL 5 MG PO TABS: 5.0000 mg | ORAL_TABLET | Freq: Every day | ORAL | 3 refills | Status: DC

## 2022-12-10 MED ORDER — LOSARTAN POTASSIUM 100 MG PO TABS: 100.0000 mg | ORAL_TABLET | Freq: Every day | ORAL | 3 refills | Status: DC

## 2022-12-10 MED ORDER — HYDROCHLOROTHIAZIDE 25 MG PO TABS: 25.0000 mg | ORAL_TABLET | Freq: Every day | ORAL | 3 refills | Status: DC

## 2022-12-18 HISTORY — PX: NASAL SINUS SURGERY: SHX719

## 2023-01-04 IMAGING — CR DG CHEST 2V
2 series · 2 of 2 positions shown · non-contrast
Comparison: None.

CLINICAL DATA: Palpitations, nausea, hypertension.

EXAM:
CHEST - 2 VIEW

[w chest pa]
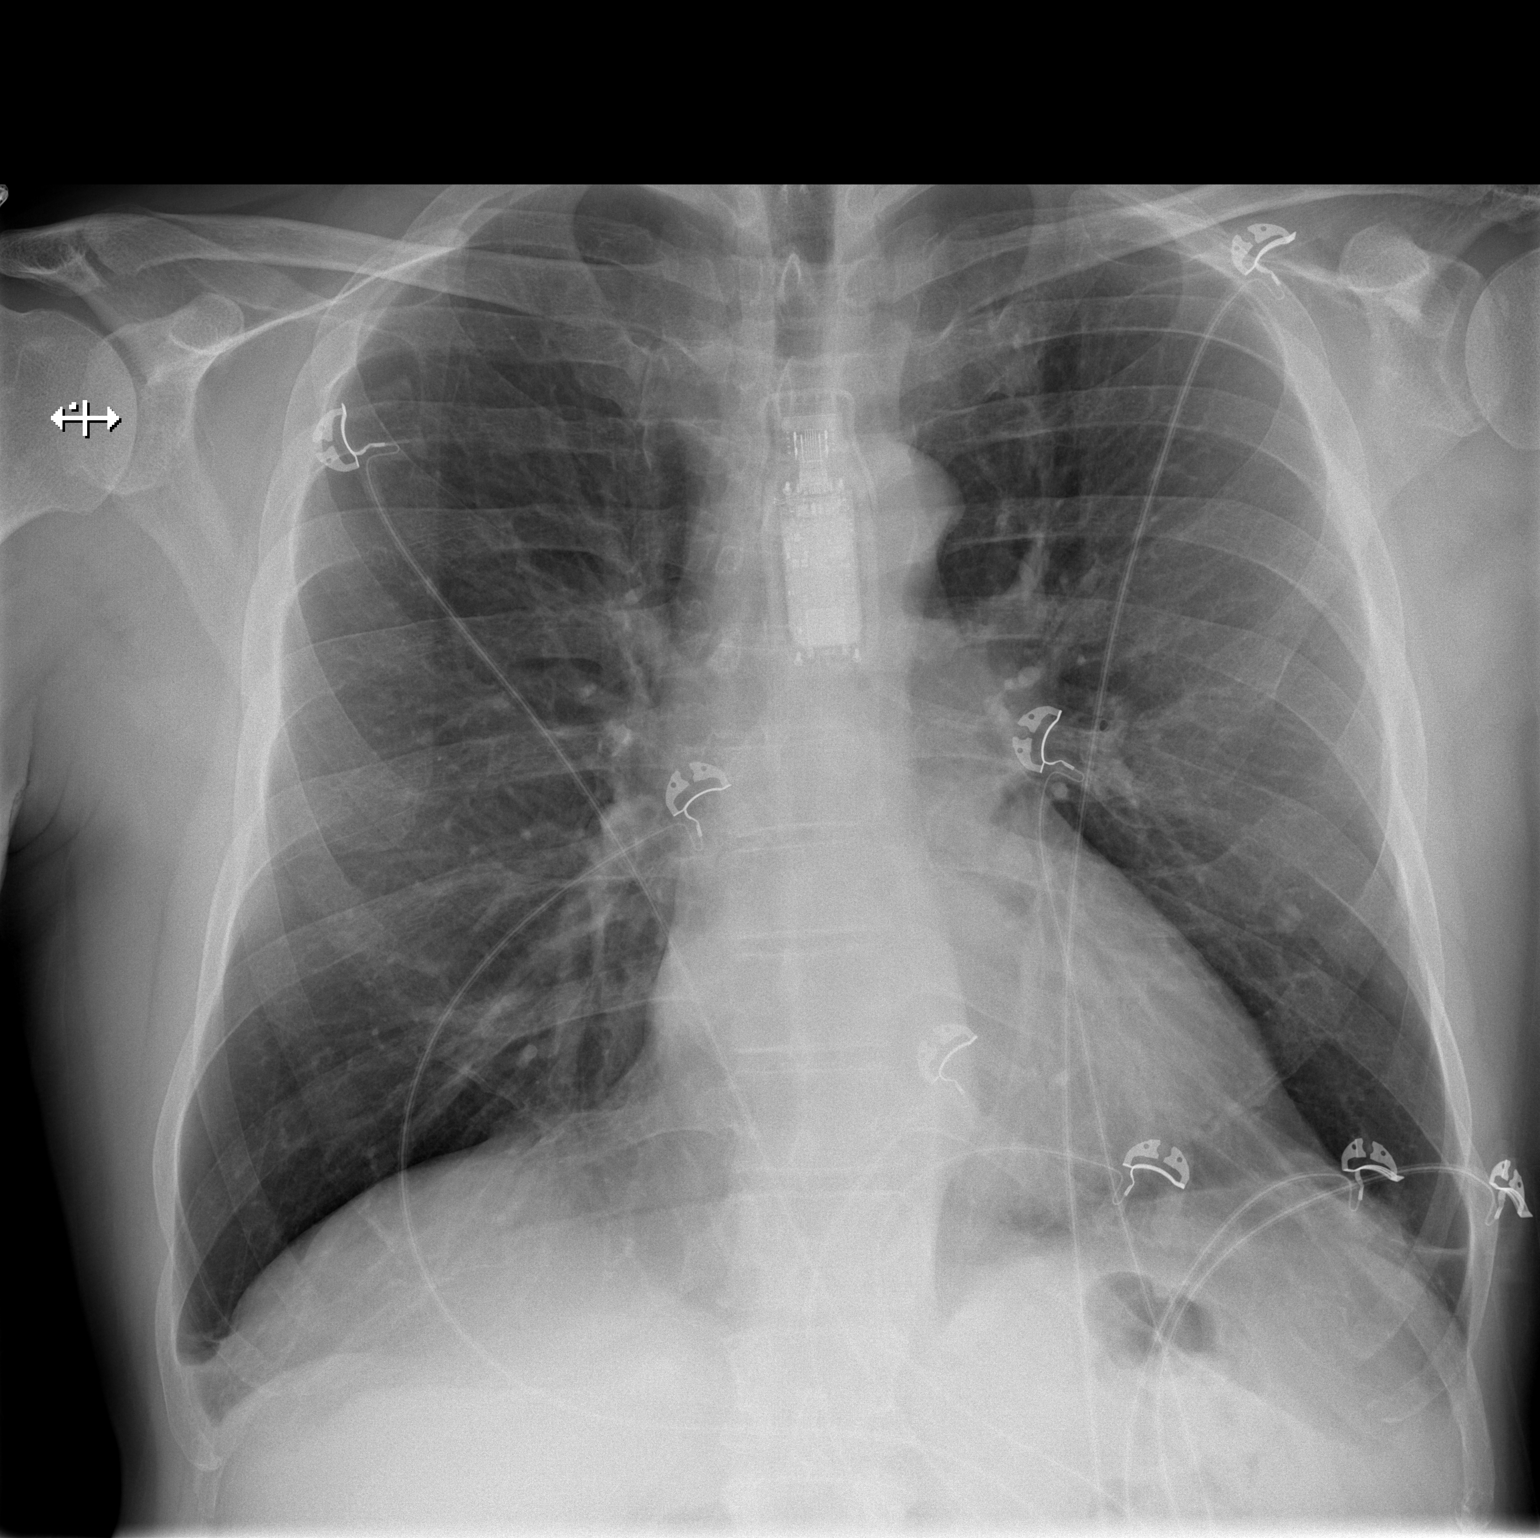

[w chest lat]
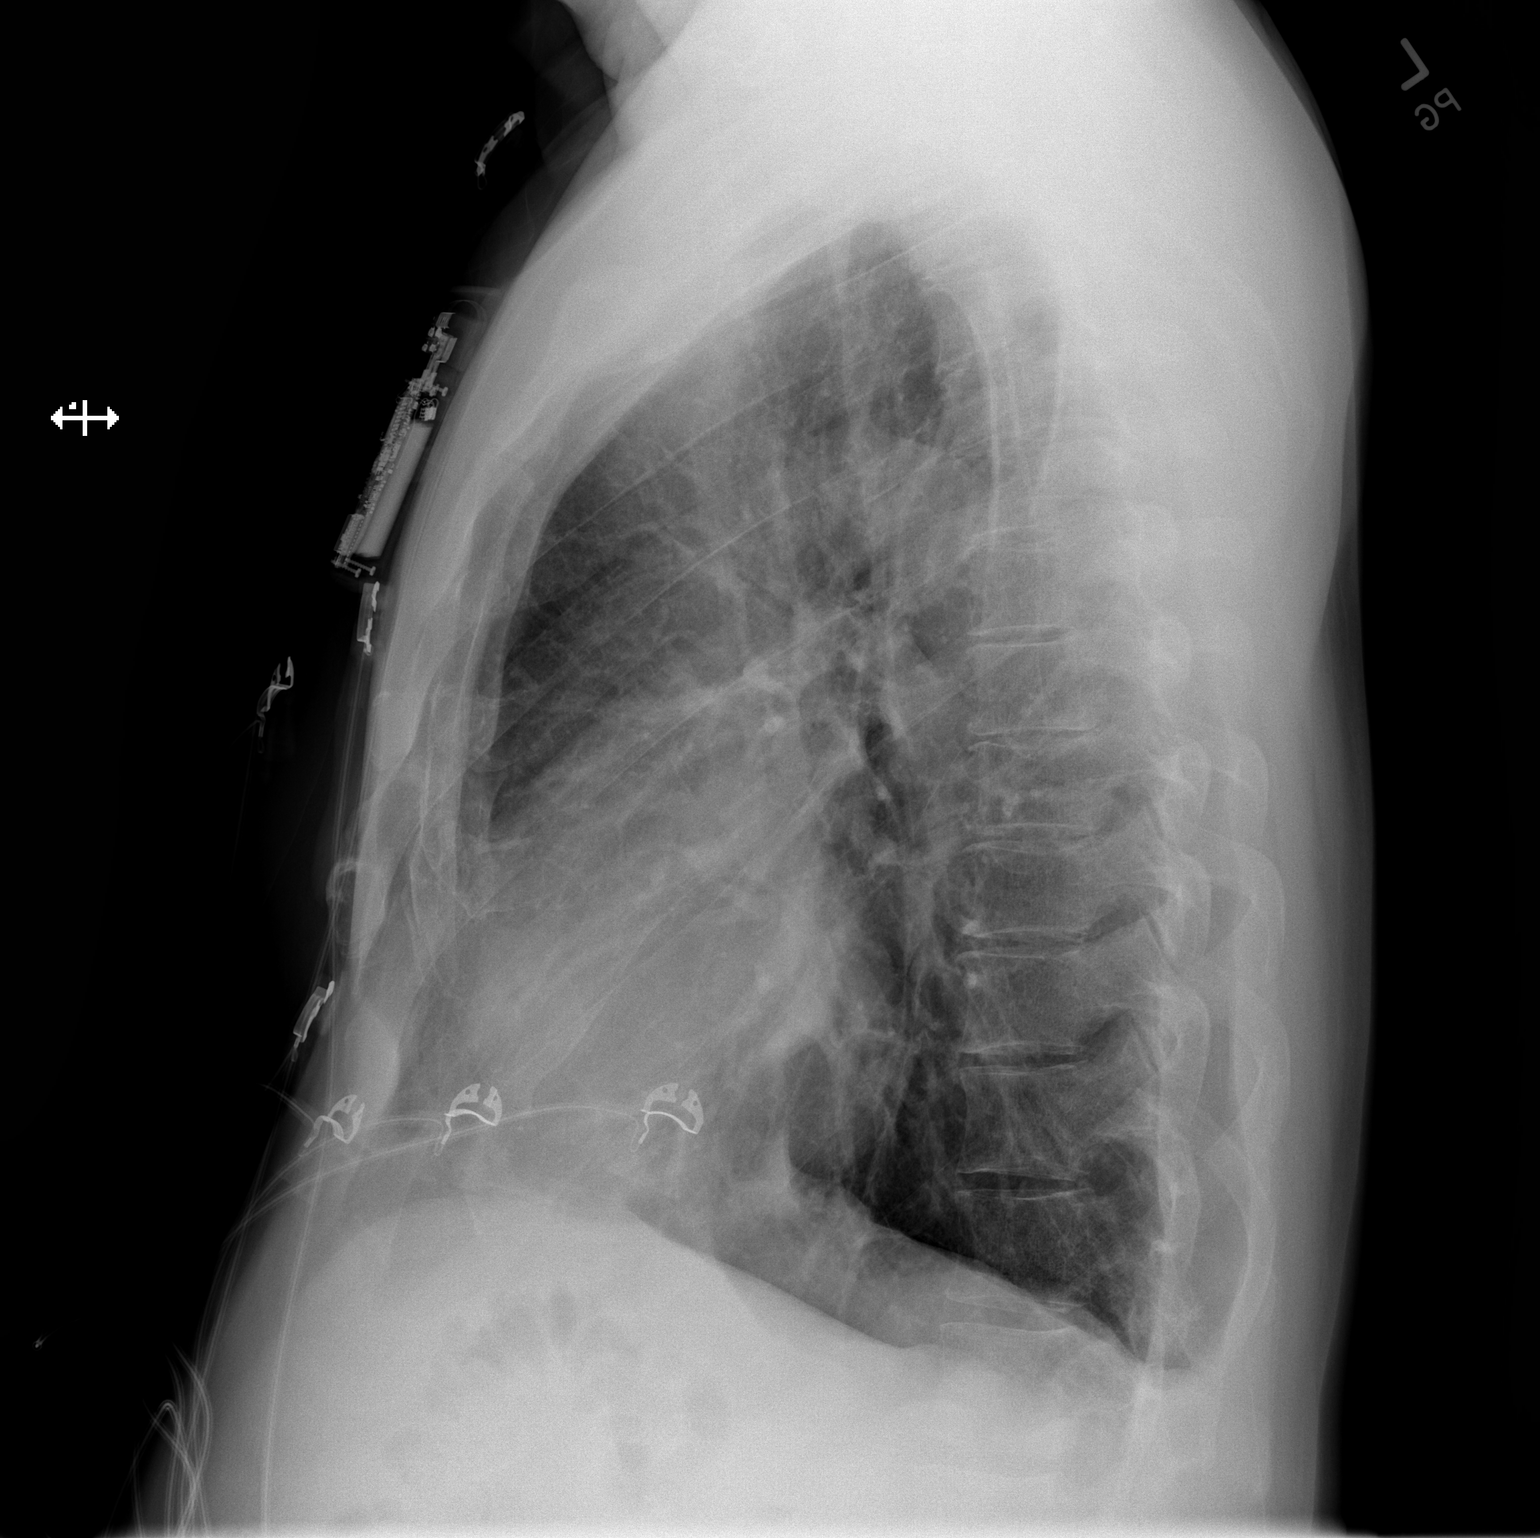

[2 of 2 positions shown; findings below may reference images not displayed]

FINDINGS: The heart size and mediastinal contours are within normal limits.
Both lungs are clear. The visualized skeletal structures are
unremarkable. Electronic device is seen in the midline overlying the
anterior upper chest and there are multiple overlying monitor wires.
IMPRESSION: No evidence of acute chest disease.

## 2023-01-17 ENCOUNTER — Encounter: Payer: Self-pay | Admitting: Family Medicine

## 2023-01-17 DIAGNOSIS — F5102 Adjustment insomnia: Secondary | ICD-10-CM

## 2023-01-17 MED ORDER — ESZOPICLONE 1 MG PO TABS
1.0000 mg | ORAL_TABLET | Freq: Every evening | ORAL | 2 refills | Status: DC | PRN
Start: 2023-01-17 — End: 2023-05-01

## 2023-01-17 NOTE — Telephone Encounter (Signed)
Please review and advise. Thanks. Dm/cma  

## 2023-01-20 ENCOUNTER — Encounter: Payer: Self-pay | Admitting: Family Medicine

## 2023-01-20 DIAGNOSIS — E876 Hypokalemia: Secondary | ICD-10-CM

## 2023-01-21 MED ORDER — POTASSIUM CHLORIDE CRYS ER 20 MEQ PO TBCR
40.0000 meq | EXTENDED_RELEASE_TABLET | Freq: Every day | ORAL | 3 refills | Status: DC
Start: 2023-01-21 — End: 2023-09-29

## 2023-02-12 ENCOUNTER — Ambulatory Visit: Payer: 59 | Admitting: Family Medicine

## 2023-02-26 ENCOUNTER — Ambulatory Visit (INDEPENDENT_AMBULATORY_CARE_PROVIDER_SITE_OTHER): Payer: 59 | Admitting: Family Medicine

## 2023-02-26 ENCOUNTER — Encounter: Payer: Self-pay | Admitting: Family Medicine

## 2023-02-26 VITALS — BP 128/70 | HR 82 | Temp 97.8°F | Ht 72.0 in | Wt 190.0 lb

## 2023-02-26 DIAGNOSIS — I10 Essential (primary) hypertension: Secondary | ICD-10-CM | POA: Diagnosis not present

## 2023-02-26 DIAGNOSIS — J310 Chronic rhinitis: Secondary | ICD-10-CM

## 2023-02-26 DIAGNOSIS — E876 Hypokalemia: Secondary | ICD-10-CM | POA: Diagnosis not present

## 2023-02-26 DIAGNOSIS — I4891 Unspecified atrial fibrillation: Secondary | ICD-10-CM | POA: Diagnosis not present

## 2023-02-26 DIAGNOSIS — Z23 Encounter for immunization: Secondary | ICD-10-CM

## 2023-02-26 NOTE — Progress Notes (Signed)
[facility] [address] [place] [address] Dept: [phone] Dept Fax: [phone]  Chronic Care Office Visit  Subjective:    Patient ID: Clarence Perez, male    DOB: 07/29/1961, 62 y.o..   MRN: [id number]  Chief Complaint  Patient presents with   Medical Management of Chronic Issues    3 month f/u.   No concerns.     History of Present Illness:  Patient is in today for reassessment of chronic medical issues.  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 He is currently managed on losartan 100 mg daily. HCTZ 25 mg daily, and amiloride 5 mg daily. The amiloride was added to address persistent hypokalemia issues that had developed. He is tolerating his medications well at this point.   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 Dr. Everardo All (ENT) compelted a right sinus surgery this Spring. Dr. Percell Boston feels his symptoms are now much improved. He has been able to stop use of Afrin and can now use a nasal mask for his sleep apnea.  Past Medical History: Patient Active Problem List   Diagnosis Date Noted   Hypokalemia 06/26/2022   Left anterior knee pain 06/26/2022   Mild intermittent asthma 12/11/2021   Adjustment insomnia 10/30/2021   Allergic rhinitis 09/29/2021   Rhinitis, chronic 07/24/2021   Nasal turbinate hypertrophy 07/24/2021   Deviated septum 07/24/2021   Atrial fibrillation with rapid ventricular response (HCC) 06/25/2021   Grade I diastolic dysfunction 06/25/2021   Hyperglycemia    Essential hypertension 11/04/2018   Gastro-esophageal reflux disease with esophagitis 12/04/2012   History of prostate cancer 06/02/2012   Obstructive sleep apnea 09/20/2011   Past Surgical History:  Procedure Laterality Date   COLONOSCOPY  09/2021   NASAL SINUS SURGERY  12/2022   prostate cryoblation     Family History  Problem Relation Age of Onset   Colon cancer Mother    Cancer Mother        colon    Hypertension Mother    Cancer Father        prostate and kidney   CAD Maternal Uncle    Heart disease Paternal Uncle    Cancer Maternal Grandmother        Kidney   Rectal cancer Neg Hx    Stomach cancer Neg Hx    Esophageal cancer Neg Hx    Outpatient Medications Prior to Visit  Medication Sig Dispense Refill   albuterol (VENTOLIN HFA) 108 (90 Base) MCG/ACT inhaler Inhale 2 puffs into the lungs every 4 (four) hours as needed for shortness of breath. 8 g 1   aMILoride (MIDAMOR) 5 MG tablet Take 1 tablet (5 mg total) by mouth daily. 90 tablet 3   carvedilol (COREG) 12.5 MG tablet Take 12.5 mg by mouth 2 (two) times daily.     diclofenac Sodium (VOLTAREN) 1 % GEL Apply 2 g topically as needed (sholduer pain). 300 g 1   doxycycline (VIBRA-TABS) 100 MG tablet Take by mouth.     ELIQUIS 5 MG TABS tablet TAKE 1 TABLET BY MOUTH TWICE  DAILY 180 tablet 3   EPINEPHrine 0.3 mg/0.3 mL IJ SOAJ injection Inject into the muscle.     eszopiclone (LUNESTA) 1 MG TABS tablet Take 1 tablet (1 mg total) by mouth at bedtime as needed for sleep. Take immediately before bedtime 30 tablet 2   fluticasone (FLONASE) 50 MCG/ACT nasal spray Place 1 spray into both nostrils in the morning and at  bedtime.     hydrochlorothiazide (HYDRODIURIL) 25 MG tablet Take 1 tablet (25 mg total) by mouth daily. 90 tablet 3   losartan (COZAAR) 100 MG tablet Take 1 tablet (100 mg total) by mouth daily. 90 tablet 3   omeprazole (PRILOSEC) 40 MG capsule TAKE 1 CAPSULE BY MOUTH DAILY (Patient taking differently: Take 40 mg by mouth daily.) 90 capsule 3   oxyCODONE (OXY IR/ROXICODONE) 5 MG immediate release tablet Take by mouth.     potassium chloride SA (KLOR-CON M) 20 MEQ tablet Take 2 tablets (40 mEq total) by mouth daily. 180 tablet 3   White Petrolatum-Mineral Oil (ARTIFICIAL TEARS) OINT ophthalmic ointment Apply to eye.     azelastine (ASTELIN) 0.1 % nasal spray USE 2 SPRAYS IN EACH NOSTRIL TWICE DAILY AS DIRECTED (Patient taking  differently: Place 2 sprays into both nostrils 2 (two) times daily.) 90 mL 3   montelukast (SINGULAIR) 10 MG tablet Take 1 tablet (10 mg total) by mouth at bedtime. 30 tablet 3   oxymetazoline (NASAL RELIEF) 0.05 % nasal spray Place into the nose.     No facility-administered medications prior to visit.   Allergies  Allergen Reactions   Ace Inhibitors Cough   Objective:   Today's Vitals   02/26/23 1343  BP: 128/70  Pulse: 82  Temp: 97.8 F (36.6 C)  TempSrc: Temporal  SpO2: 97%  Weight: 190 lb (86.2 kg)  Height: 6' (1.829 m)   Body mass index is 25.77 kg/m.   General: Well developed, well nourished. No acute distress. Psych: Alert and oriented. Normal mood and affect.  Health Maintenance Due  Topic Date Due   HIV Screening  Never done   DTaP/Tdap/Td (2 - Td or Tdap) 07/21/2019     Assessment & Plan:   Problem List Items Addressed This Visit       Cardiovascular and Mediastinum   Essential hypertension    Blood pressure is in good control. Continue losartan 100 mg daily, HCTZ 25 mg daily, and amiloride 5 mg daily. Dr. Percell Boston asked about secondary causes of hypertension, esp. primary hyperaldosteronism, at his last visit. He does have risk factors for this with refractory hypertension and hypokalemia. I had ordered an aldosterone and renin activity to assess, but this was not run. I asked that this be run today. I will also check his potassium to make sure he is maintaining his potassium at present.       Relevant Medications   carvedilol (COREG) 12.5 MG tablet   Other Relevant Orders   Basic metabolic panel   Atrial fibrillation with rapid ventricular response (HCC) - Primary    Stable. No periods of a. fib since ablation.      Relevant Medications   carvedilol (COREG) 12.5 MG tablet     Respiratory   Rhinitis, chronic    Improved s/p surgery.        Other   Hypokalemia    I will recheck potassium today.      Other Visit Diagnoses     Need for Td  vaccine       Relevant Orders   Td vaccine greater than or equal to 7yo preservative free IM       Return in about 3 months (around 05/29/2023) for Reassessment.   Loyola Mast, MD

## 2023-02-26 NOTE — Assessment & Plan Note (Signed)
Stable. No periods of a. fib since ablation.

## 2023-02-26 NOTE — Assessment & Plan Note (Signed)
Improved s/p surgery 

## 2023-02-26 NOTE — Assessment & Plan Note (Signed)
Blood pressure is in good control. Continue losartan 100 mg daily, HCTZ 25 mg daily, and amiloride 5 mg daily. Dr. Percell Boston asked about secondary causes of hypertension, esp. primary hyperaldosteronism, at his last visit. He does have risk factors for this with refractory hypertension and hypokalemia. I had ordered an aldosterone and renin activity to assess, but this was not run. I asked that this be run today. I will also check his potassium to make sure he is maintaining his potassium at present.

## 2023-02-26 NOTE — Assessment & Plan Note (Signed)
I will recheck potassium today.

## 2023-02-27 LAB — BASIC METABOLIC PANEL
BUN: 15 mg/dL (ref 6–23)
CO2: 31 mEq/L (ref 19–32)
Calcium: 9.8 mg/dL (ref 8.4–10.5)
Chloride: 100 mEq/L (ref 96–112)
Creatinine, Ser: 0.93 mg/dL (ref 0.40–1.50)
GFR: 87.92 mL/min (ref >60.00)
Glucose, Bld: 97 mg/dL (ref 70–99)
Potassium: 3.9 mEq/L (ref 3.5–5.1)
Sodium: 138 mEq/L (ref 135–145)

## 2023-03-28 ENCOUNTER — Other Ambulatory Visit (HOSPITAL_COMMUNITY): Payer: Self-pay | Admitting: Physician Assistant

## 2023-03-28 ENCOUNTER — Other Ambulatory Visit: Payer: Self-pay | Admitting: Nurse Practitioner

## 2023-04-26 ENCOUNTER — Encounter: Payer: Self-pay | Admitting: Family Medicine

## 2023-04-28 MED ORDER — APIXABAN 5 MG PO TABS: 5.0000 mg | ORAL_TABLET | Freq: Two times a day (BID) | ORAL | 1 refills | Status: DC

## 2023-04-30 ENCOUNTER — Encounter: Payer: Self-pay | Admitting: Family Medicine

## 2023-04-30 DIAGNOSIS — F5102 Adjustment insomnia: Secondary | ICD-10-CM

## 2023-05-01 MED ORDER — ESZOPICLONE 1 MG PO TABS: 1.0000 mg | ORAL_TABLET | Freq: Every evening | ORAL | 5 refills | Status: DC | PRN

## 2023-05-01 NOTE — Telephone Encounter (Signed)
Refill request for  Eszopiclone 0.3 mg LR 01/17/23, # 30, 2 rf LOV  02/26/23 FOV none scheduled.   Please review and advise.  Thanks. Dm/cma

## 2023-05-21 ENCOUNTER — Encounter: Payer: Self-pay | Admitting: Family Medicine

## 2023-05-21 ENCOUNTER — Ambulatory Visit: Payer: 59 | Admitting: Podiatry

## 2023-05-21 ENCOUNTER — Ambulatory Visit: Payer: 59 | Admitting: Family Medicine

## 2023-05-21 VITALS — BP 126/74 | HR 77 | Temp 98.3°F | Ht 72.0 in | Wt 197.8 lb

## 2023-05-21 DIAGNOSIS — R1031 Right lower quadrant pain: Secondary | ICD-10-CM | POA: Diagnosis not present

## 2023-05-21 DIAGNOSIS — Q6672 Congenital pes cavus, left foot: Secondary | ICD-10-CM | POA: Diagnosis not present

## 2023-05-21 DIAGNOSIS — Q828 Other specified congenital malformations of skin: Secondary | ICD-10-CM

## 2023-05-21 DIAGNOSIS — I10 Essential (primary) hypertension: Secondary | ICD-10-CM | POA: Diagnosis not present

## 2023-05-21 DIAGNOSIS — Z23 Encounter for immunization: Secondary | ICD-10-CM

## 2023-05-21 DIAGNOSIS — Q6671 Congenital pes cavus, right foot: Secondary | ICD-10-CM | POA: Diagnosis not present

## 2023-05-21 DIAGNOSIS — Q667 Congenital pes cavus, unspecified foot: Secondary | ICD-10-CM

## 2023-05-21 NOTE — Assessment & Plan Note (Signed)
Blood pressure is in good control. Continue losartan 100 mg daily, HCTZ 25 mg daily, and amiloride 5 mg daily.

## 2023-05-21 NOTE — Progress Notes (Signed)
Subjective:  Patient ID: Clarence Perez, male    DOB: Jan 28, 1961,  MRN: [id number]  Chief Complaint  Patient presents with   Foot Pain    Place in the center of right foot causing discomfort     62 y.o. male presents with the above complaint.  Patient presents with right submetatarsal 3 porokeratotic lesion painful to touch is progressive gotten worse worse with ambulation worse with pressure he has no severe problems prior to seeing me he would like to discuss treatment options for this.  Pain scale 7 out of 10 dull aching nature he also does not wear good supportive orthotics.  He would like to look into this as well.   Review of Systems: Negative except as noted in the HPI. Denies N/V/F/Ch.  Past Medical History:  Diagnosis Date   Asthma    Cancer (HCC)    prostate    GERD (gastroesophageal reflux disease)    Hypertension    Sleep apnea     Current Outpatient Medications:    albuterol (VENTOLIN HFA) 108 (90 Base) MCG/ACT inhaler, Inhale 2 puffs into the lungs every 4 (four) hours as needed for shortness of breath., Disp: 8 g, Rfl: 1   aMILoride (MIDAMOR) 5 MG tablet, Take 1 tablet (5 mg total) by mouth daily., Disp: 90 tablet, Rfl: 3   apixaban (ELIQUIS) 5 MG TABS tablet, Take 1 tablet (5 mg total) by mouth 2 (two) times daily., Disp: 180 tablet, Rfl: 1   carvedilol (COREG) 12.5 MG tablet, TAKE 1 TABLET BY MOUTH TWICE  DAILY WITH MEALS, Disp: 180 tablet, Rfl: 3   diclofenac Sodium (VOLTAREN) 1 % GEL, Apply 2 g topically as needed (sholduer pain)., Disp: 300 g, Rfl: 1   EPINEPHrine 0.3 mg/0.3 mL IJ SOAJ injection, Inject into the muscle., Disp: , Rfl:    eszopiclone (LUNESTA) 1 MG TABS tablet, Take 1 tablet (1 mg total) by mouth at bedtime as needed for sleep. Take immediately before bedtime, Disp: 30 tablet, Rfl: 5   fluticasone (FLONASE) 50 MCG/ACT nasal spray, Place 1 spray into both nostrils in the morning and at bedtime., Disp: , Rfl:    hydrochlorothiazide (HYDRODIURIL) 25  MG tablet, Take 1 tablet (25 mg total) by mouth daily., Disp: 90 tablet, Rfl: 3   losartan (COZAAR) 100 MG tablet, Take 1 tablet (100 mg total) by mouth daily., Disp: 90 tablet, Rfl: 3   omeprazole (PRILOSEC) 40 MG capsule, TAKE 1 CAPSULE BY MOUTH DAILY (Patient taking differently: Take 40 mg by mouth daily.), Disp: 90 capsule, Rfl: 3   oxyCODONE (OXY IR/ROXICODONE) 5 MG immediate release tablet, Take by mouth., Disp: , Rfl:    potassium chloride SA (KLOR-CON M) 20 MEQ tablet, Take 2 tablets (40 mEq total) by mouth daily., Disp: 180 tablet, Rfl: 3   White Petrolatum-Mineral Oil (ARTIFICIAL TEARS) OINT ophthalmic ointment, Apply to eye., Disp: , Rfl:   Social History   Tobacco Use  Smoking Status Never  Smokeless Tobacco Never    Allergies  Allergen Reactions   Ace Inhibitors Cough   Objective:  There were no vitals filed for this visit. There is no height or weight on file to calculate BMI. Constitutional Well developed. Well nourished.  Vascular Dorsalis pedis pulses palpable bilaterally. Posterior tibial pulses palpable bilaterally. Capillary refill normal to all digits.  No cyanosis or clubbing noted. Pedal hair growth normal.  Neurologic Normal speech. Oriented to person, place, and time. Epicritic sensation to light touch grossly present bilaterally.  Dermatologic Nails well  groomed and normal in appearance. No open wounds. No skin lesions.  Orthopedic: Right submetatarsal 3 porokeratotic lesion painful to touch with central nucleated core.  Gait examination shows pes cavus foot structure semiflexible in nature Coleman block test showed reduced posterior cavus.   Radiographs: None Assessment:   1. Pes cavus   2. Porokeratosis    Plan:  Patient was evaluated and treated and all questions answered.  Right submetatarsal 3 porokeratosis -All questions and concerns were discussed with the patient in extensive detail -Given the amount of porokeratotic lesion that was  present patient will benefit from debridement of the lesion using chisel blade handle the lesion was debrided down to healthy dry tissue no complication and no pinpoint bleeding noted  Pes cavus -I explained to patient the etiology of pes cavus and relationship with Planter fasciitis and various treatment options were discussed.  Given patient foot structure in the setting of Planter fasciitis I believe patient will benefit from custom-made orthotics to help control the hindfoot motion support the arch of the foot and take the stress away from plantar fascial.  Patient agrees with the plan like to proceed with orthotics -Patient was casted for orthotics with offloading of bilateral submetatarsal 3   No follow-ups on file.

## 2023-05-21 NOTE — Progress Notes (Signed)
[facility] [address] [place] [address] Dept: [phone] Dept Fax: [phone]  Office Visit  Subjective:    Patient ID: Clarence Perez, male    DOB: [birth year], 62 y.o..   MRN: [id number]  Chief Complaint  Patient presents with   Groin Pain    C/o having grion pain x 1.5 months.  Has taken Tylenol.    History of Present Illness:  Patient is in today for evaluation of recent right inguinal pain. 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 He has noted an asymmetric bulging on the right side in the inguinal area. More recently, he has had some episodes of pain. These have not been associated with straining or lifting. He was having more pain when he scheduled the appointment, but now it has subsided somewhat. He denies any swelling down into the scrotum.  Past Medical History: Patient Active Problem List   Diagnosis Date Noted   Hypokalemia 06/26/2022   Left anterior knee pain 06/26/2022   Mild intermittent asthma 12/11/2021   Adjustment insomnia 10/30/2021   Allergic rhinitis 09/29/2021   Rhinitis, chronic 07/24/2021   Nasal turbinate hypertrophy 07/24/2021   Deviated septum 07/24/2021   Atrial fibrillation with rapid ventricular response (HCC) 06/25/2021   Grade I diastolic dysfunction 06/25/2021   Hyperglycemia    Essential hypertension 11/04/2018   Gastro-esophageal reflux disease with esophagitis 12/04/2012   History of prostate cancer 06/02/2012   Obstructive sleep apnea 09/20/2011   Past Surgical History:  Procedure Laterality Date   COLONOSCOPY  09/2021   NASAL SINUS SURGERY  12/2022   prostate cryoblation     Family History  Problem Relation Age of Onset   Colon cancer Mother    Cancer Mother        colon   Hypertension Mother    Cancer Father        prostate and kidney   CAD Maternal Uncle    Heart disease Paternal Uncle    Cancer  Maternal Grandmother        Kidney   Rectal cancer Neg Hx    Stomach cancer Neg Hx    Esophageal cancer Neg Hx    Outpatient Medications Prior to Visit  Medication Sig Dispense Refill   albuterol (VENTOLIN HFA) 108 (90 Base) MCG/ACT inhaler Inhale 2 puffs into the lungs every 4 (four) hours as needed for shortness of breath. 8 g 1   aMILoride (MIDAMOR) 5 MG tablet Take 1 tablet (5 mg total) by mouth daily. 90 tablet 3   apixaban (ELIQUIS) 5 MG TABS tablet Take 1 tablet (5 mg total) by mouth 2 (two) times daily. 180 tablet 1   carvedilol (COREG) 12.5 MG tablet TAKE 1 TABLET BY MOUTH TWICE  DAILY WITH MEALS 180 tablet 3   diclofenac Sodium (VOLTAREN) 1 % GEL Apply 2 g topically as needed (sholduer pain). 300 g 1   EPINEPHrine 0.3 mg/0.3 mL IJ SOAJ injection Inject into the muscle.     eszopiclone (LUNESTA) 1 MG TABS tablet Take 1 tablet (1 mg total) by mouth at bedtime as needed for sleep. Take immediately before bedtime 30 tablet 5   fluticasone (FLONASE) 50 MCG/ACT nasal spray Place 1 spray into both nostrils in the morning and at bedtime.     hydrochlorothiazide (HYDRODIURIL) 25 MG tablet Take 1 tablet (25 mg total) by mouth daily. 90 tablet 3   losartan (COZAAR) 100 MG tablet Take 1 tablet (100  mg total) by mouth daily. 90 tablet 3   omeprazole (PRILOSEC) 40 MG capsule TAKE 1 CAPSULE BY MOUTH DAILY (Patient taking differently: Take 40 mg by mouth daily.) 90 capsule 3   oxyCODONE (OXY IR/ROXICODONE) 5 MG immediate release tablet Take by mouth.     potassium chloride SA (KLOR-CON M) 20 MEQ tablet Take 2 tablets (40 mEq total) by mouth daily. 180 tablet 3   White Petrolatum-Mineral Oil (ARTIFICIAL TEARS) OINT ophthalmic ointment Apply to eye.     azelastine (ASTELIN) 0.1 % nasal spray USE 2 SPRAYS IN EACH NOSTRIL TWICE DAILY AS DIRECTED (Patient taking differently: Place 2 sprays into both nostrils 2 (two) times daily.) 90 mL 3   montelukast (SINGULAIR) 10 MG tablet Take 1 tablet (10 mg total)  by mouth at bedtime. 30 tablet 3   oxymetazoline (NASAL RELIEF) 0.05 % nasal spray Place into the nose.     No facility-administered medications prior to visit.   Allergies  Allergen Reactions   Ace Inhibitors Cough     Objective:   Today's Vitals   05/21/23 1342  BP: 126/74  Pulse: 77  Temp: 98.3 F (36.8 C)  TempSrc: Temporal  SpO2: 97%  Weight: 197 lb 12.8 oz (89.7 kg)  Height: 6' (1.829 m)   Body mass index is 26.83 kg/m.   General: Well developed, well nourished. No acute distress. GU: There is a mild asymmetry with prominence of the right pubic/inguinal area.  Psych: Alert and oriented. Normal mood and affect.  Health Maintenance Due  Topic Date Due   HIV Screening  Never done   DTaP/Tdap/Td (2 - Td or Tdap) 07/21/2019   Imaging: CT of Abdomen and Pelvis w contrast (05/23/2022) IMPRESSION: 1. Hiatal hernia with imaging findings of gastroenteritis. No bowel obstruction or mesenteric inflammatory change. 2. Constipation with left colonic diverticulosis greatest in the sigmoid. 3. Cystitis versus bladder nondistention. 4. Umbilical and right greater than left inguinal fat hernias.    Assessment & Plan:   Problem List Items Addressed This Visit       Cardiovascular and Mediastinum   Essential hypertension    Blood pressure is in good control. Continue losartan 100 mg daily, HCTZ 25 mg daily, and amiloride 5 mg daily.      Other Visit Diagnoses     Right inguinal pain    -  Primary   As this has improved and the exam is fairly normal, we will follow expectant management for now.   Need for Tdap vaccination       Relevant Orders   Tdap vaccine greater than or equal to 7yo IM (Completed)       Return in about 3 months (around 08/21/2023) for Reassessment.   Loyola Mast, MD

## 2023-05-28 NOTE — Progress Notes (Signed)
Orthotics on order will return for fitting when in  [place]

## 2023-06-18 ENCOUNTER — Other Ambulatory Visit: Payer: Self-pay | Admitting: Family Medicine

## 2023-06-24 ENCOUNTER — Encounter: Payer: Self-pay | Admitting: Podiatry

## 2023-07-02 ENCOUNTER — Ambulatory Visit: Payer: Managed Care, Other (non HMO)

## 2023-07-02 NOTE — Progress Notes (Signed)
Patient presents today to pick up custom molded foot orthotics, diagnosed with Pes cavus by Dr. Allena Katz .   Orthotics were dispensed and fit was satisfactory. Reviewed instructions for break-in and wear. Written instructions given to patient.  Patient will follow up as needed.   Addison Bailey Cped CFo, CFm

## 2023-07-27 ENCOUNTER — Encounter: Payer: Self-pay | Admitting: Family Medicine

## 2023-07-28 MED ORDER — CARVEDILOL 12.5 MG PO TABS: 12.5000 mg | ORAL_TABLET | Freq: Two times a day (BID) | ORAL | 3 refills | Status: DC

## 2023-07-28 MED ORDER — APIXABAN 5 MG PO TABS: 5.0000 mg | ORAL_TABLET | Freq: Two times a day (BID) | ORAL | 1 refills | Status: DC

## 2023-07-30 ENCOUNTER — Ambulatory Visit: Payer: Managed Care, Other (non HMO)

## 2023-07-30 NOTE — Progress Notes (Signed)
Re-ordered new orthotics from [facility] patient is not tolerating Foot maxx orthotics  Feels as if arch alignment is off and too much pressure still on FF  Sheryle Vice Cped, CFo, CFm

## 2023-08-18 ENCOUNTER — Telehealth: Payer: Self-pay

## 2023-08-18 NOTE — Telephone Encounter (Signed)
Left voicemail to schedule orthotic pick up

## 2023-08-25 DIAGNOSIS — Z9889 Other specified postprocedural states: Secondary | ICD-10-CM | POA: Insufficient documentation

## 2023-08-31 ENCOUNTER — Encounter: Payer: Self-pay | Admitting: Family Medicine

## 2023-08-31 DIAGNOSIS — I10 Essential (primary) hypertension: Secondary | ICD-10-CM

## 2023-09-01 MED ORDER — HYDROCHLOROTHIAZIDE 25 MG PO TABS: 25.0000 mg | ORAL_TABLET | Freq: Every day | ORAL | 1 refills | Status: DC

## 2023-09-01 MED ORDER — LOSARTAN POTASSIUM 100 MG PO TABS
100.0000 mg | ORAL_TABLET | Freq: Every day | ORAL | 1 refills | Status: DC
Start: 2023-09-01 — End: 2023-09-08

## 2023-09-01 MED ORDER — AMILORIDE HCL 5 MG PO TABS
5.0000 mg | ORAL_TABLET | Freq: Every day | ORAL | 1 refills | Status: DC
Start: 2023-09-01 — End: 2023-09-08

## 2023-09-07 ENCOUNTER — Encounter: Payer: Self-pay | Admitting: Family Medicine

## 2023-09-07 DIAGNOSIS — I10 Essential (primary) hypertension: Secondary | ICD-10-CM

## 2023-09-08 MED ORDER — LOSARTAN POTASSIUM 100 MG PO TABS: 100.0000 mg | ORAL_TABLET | Freq: Every day | ORAL | 1 refills | Status: DC

## 2023-09-08 MED ORDER — AMILORIDE HCL 5 MG PO TABS: 5.0000 mg | ORAL_TABLET | Freq: Every day | ORAL | 1 refills | Status: DC

## 2023-09-10 ENCOUNTER — Ambulatory Visit: Payer: Managed Care, Other (non HMO)

## 2023-09-10 NOTE — Progress Notes (Signed)
Remake of orthotics delivered today I lightly ground distal area of Rt. CFO as patient felt the area was too prominent  Patient will try and will call office if any other problems arise

## 2023-09-23 ENCOUNTER — Telehealth: Payer: Self-pay

## 2023-09-23 NOTE — Telephone Encounter (Signed)
Left pt voicemail. He wants to set up a orthotic adjustment. He said he prefers Wednesday's

## 2023-09-24 ENCOUNTER — Ambulatory Visit: Payer: Managed Care, Other (non HMO) | Admitting: Family Medicine

## 2023-09-24 ENCOUNTER — Other Ambulatory Visit (HOSPITAL_COMMUNITY): Payer: Self-pay

## 2023-09-24 ENCOUNTER — Encounter: Payer: Self-pay | Admitting: Family Medicine

## 2023-09-24 VITALS — BP 134/78 | HR 77 | Temp 98.3°F | Ht 72.0 in | Wt 197.6 lb

## 2023-09-24 DIAGNOSIS — F5102 Adjustment insomnia: Secondary | ICD-10-CM | POA: Diagnosis not present

## 2023-09-24 DIAGNOSIS — R109 Unspecified abdominal pain: Secondary | ICD-10-CM

## 2023-09-24 MED ORDER — DAYVIGO 5 MG PO TABS: 5.0000 mg | ORAL_TABLET | Freq: Every evening | ORAL | 2 refills | Status: DC | PRN

## 2023-09-24 NOTE — Assessment & Plan Note (Signed)
 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 I recommend we switch to a different class of sleep medicine. I will try him on lemborexant  (Dayvigo ) 5 mg daily.

## 2023-09-24 NOTE — Progress Notes (Signed)
 [facility] [address] [place] [address] Dept: [phone] Dept Fax: [phone]  Office Visit  Subjective:    Patient ID: Clarence Perez, male    DOB: [birth year], 63 y.o..   MRN: [id number]  Chief Complaint  Patient presents with   Back Pain    C/o having mid back pain x 6 weeks.  Has used OTC pain patches   History of Present Illness:  Patient is in today complaining of right mid-back pain for the past 6 weeks. He thinks this is likely a muscular issue. He has not noted any Gi changes, denies hematuria, is not having any sciatica, and has had no change in continence. [name] notes he has a family history of renal cancer in his father and grandmother.  [name] has a history of insomnia. He has both issues with sleep onset and sleep maintenance. He has been on eszopiclone  (Lunesta ), but is finding mixed results with this.   Past Medical History: Patient Active Problem List   Diagnosis Date Noted   Status post ablation of atrial fibrillation 08/25/2023   Hypokalemia 06/26/2022   Left anterior knee pain 06/26/2022   Mild intermittent asthma 12/11/2021   Adjustment insomnia 10/30/2021   Allergic rhinitis 09/29/2021   Rhinitis, chronic 07/24/2021   Nasal turbinate hypertrophy 07/24/2021   Deviated septum 07/24/2021   Atrial fibrillation with rapid ventricular response (HCC) 06/25/2021   Grade I diastolic dysfunction 06/25/2021   Hyperglycemia    Essential hypertension 11/04/2018   Gastro-esophageal reflux disease with esophagitis 12/04/2012   History of prostate cancer 06/02/2012   Obstructive sleep apnea 09/20/2011   Past Surgical History:  Procedure Laterality Date   COLONOSCOPY  09/2021   NASAL SINUS SURGERY  12/2022   prostate cryoblation     Family History  Problem Relation Age of Onset   Colon cancer Mother    Cancer Mother        colon   Hypertension Mother    Cancer Father         prostate and kidney   CAD Maternal Uncle    Heart disease Paternal Uncle    Cancer Maternal Grandmother        Kidney   Rectal cancer Neg Hx    Stomach cancer Neg Hx    Esophageal cancer Neg Hx    Outpatient Medications Prior to Visit  Medication Sig Dispense Refill   albuterol  (VENTOLIN  HFA) 108 (90 Base) MCG/ACT inhaler Inhale 2 puffs into the lungs every 4 (four) hours as needed for shortness of breath. 8 g 1   aMILoride  (MIDAMOR ) 5 MG tablet Take 1 tablet (5 mg total) by mouth daily. 90 tablet 1   apixaban  (ELIQUIS ) 5 MG TABS tablet Take 1 tablet (5 mg total) by mouth 2 (two) times daily. 180 tablet 1   carvedilol  (COREG ) 12.5 MG tablet Take 1 tablet (12.5 mg total) by mouth 2 (two) times daily with a meal. 180 tablet 3   diclofenac  Sodium (VOLTAREN ) 1 % GEL Apply 2 g topically as needed (sholduer pain). 300 g 1   EPINEPHrine 0.3 mg/0.3 mL IJ SOAJ injection Inject into the muscle.     fluticasone  (FLONASE ) 50 MCG/ACT nasal spray Place 1 spray into both nostrils in the morning and at bedtime.     hydrochlorothiazide  (HYDRODIURIL ) 25 MG tablet Take 1 tablet (25 mg total) by mouth daily. 90 tablet 1   losartan  (COZAAR ) 100 MG tablet Take 1 tablet (100 mg total) by  mouth daily. 90 tablet 1   omeprazole  (PRILOSEC) 40 MG capsule TAKE 1 CAPSULE BY MOUTH DAILY 90 capsule 3   oxyCODONE (OXY IR/ROXICODONE) 5 MG immediate release tablet Take by mouth.     potassium chloride  SA (KLOR-CON  M) 20 MEQ tablet Take 2 tablets (40 mEq total) by mouth daily. 180 tablet 3   White Petrolatum-Mineral Oil (ARTIFICIAL TEARS) OINT ophthalmic ointment Apply to eye.     eszopiclone  (LUNESTA ) 1 MG TABS tablet Take 1 tablet (1 mg total) by mouth at bedtime as needed for sleep. Take immediately before bedtime 30 tablet 5   No facility-administered medications prior to visit.   Allergies  Allergen Reactions   Ace Inhibitors Cough     Objective:   Today's Vitals   09/24/23 1408  BP: 134/78  Pulse: 77  Temp:  98.3 F (36.8 C)  TempSrc: Temporal  SpO2: 97%  Weight: 197 lb 9.6 oz (89.6 kg)  Height: 6' (1.829 m)   Body mass index is 26.8 kg/m.   General: Well developed, well nourished. No acute distress. Back: Straight. No tenderness to palpation. No CVA tenderness bilaterally. Psych: Alert and oriented. Normal mood and affect.  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 I recommend we switch to a different class of sleep medicine. I will try him on lemborexant  (Dayvigo ) 5 mg daily.       Relevant Medications   Lemborexant  (DAYVIGO ) 5 MG TABS   Other Visit Diagnoses       Right flank pain    -  Primary   I will check a UA and a renal CT scan to assess for kidney stones, but also resolve any concern he has towards cancer.   Relevant Orders   Urinalysis, Routine w reflex microscopic   CT RENAL STONE STUDY       Return in about 3 months (around 12/22/2023) for Reassessment.   Garnette CHRISTELLA Simpler, MD

## 2023-09-25 ENCOUNTER — Encounter: Payer: Self-pay | Admitting: Family Medicine

## 2023-09-25 LAB — URINALYSIS, ROUTINE W REFLEX MICROSCOPIC
Bilirubin Urine: NEGATIVE
Hgb urine dipstick: NEGATIVE
Ketones, ur: NEGATIVE
Leukocytes,Ua: NEGATIVE
Nitrite: NEGATIVE
RBC / HPF: NONE SEEN (ref 0–?)
Specific Gravity, Urine: 1.01 (ref 1.000–1.030)
Total Protein, Urine: NEGATIVE
Urine Glucose: NEGATIVE
Urobilinogen, UA: 0.2 (ref 0.0–1.0)
WBC, UA: NONE SEEN (ref 0–?)
pH: 6.5 (ref 5.0–8.0)

## 2023-09-28 ENCOUNTER — Encounter: Payer: Self-pay | Admitting: Family Medicine

## 2023-09-28 DIAGNOSIS — E876 Hypokalemia: Secondary | ICD-10-CM

## 2023-09-29 MED ORDER — POTASSIUM CHLORIDE CRYS ER 20 MEQ PO TBCR: 40.0000 meq | EXTENDED_RELEASE_TABLET | Freq: Every day | ORAL | 3 refills | Status: DC

## 2023-10-08 ENCOUNTER — Other Ambulatory Visit: Payer: Managed Care, Other (non HMO)

## 2023-10-22 ENCOUNTER — Ambulatory Visit
Admission: RE | Admit: 2023-10-22 | Discharge: 2023-10-22 | Disposition: A | Discharge status: Home / Self Care | Payer: Managed Care, Other (non HMO) | Source: Ambulatory Visit | Attending: Family Medicine | Admitting: Family Medicine

## 2023-10-22 ENCOUNTER — Ambulatory Visit: Payer: Managed Care, Other (non HMO)

## 2023-10-22 DIAGNOSIS — R109 Unspecified abdominal pain: Secondary | ICD-10-CM

## 2023-10-22 NOTE — Progress Notes (Signed)
 Patient was present with orthotics stating he feels he is over supinated  Added lateral posts to BIL orthotics patient will try and let me know he states posting feels more stable and neutral  I did take new impressions to make a carbon orthotic as patient is a runner and I did not know this. Lateral posting will be added  RF right 3degrees FF 2degrees  RF and FF Left 2 degrees  Neoprene top cover is good and BIL MT bars to be added as well

## 2023-11-30 ENCOUNTER — Encounter: Payer: Self-pay | Admitting: Family Medicine

## 2023-11-30 DIAGNOSIS — E876 Hypokalemia: Secondary | ICD-10-CM

## 2023-12-01 MED ORDER — POTASSIUM CHLORIDE CRYS ER 20 MEQ PO TBCR: 40.0000 meq | EXTENDED_RELEASE_TABLET | Freq: Every day | ORAL | 3 refills | Status: DC

## 2023-12-01 MED ORDER — APIXABAN 5 MG PO TABS: 5.0000 mg | ORAL_TABLET | Freq: Two times a day (BID) | ORAL | 1 refills | Status: DC

## 2024-01-07 ENCOUNTER — Telehealth: Payer: Self-pay

## 2024-01-07 NOTE — Telephone Encounter (Signed)
 LVM to resched; provider out of office

## 2024-01-11 ENCOUNTER — Encounter: Payer: Self-pay | Admitting: Family Medicine

## 2024-01-11 DIAGNOSIS — I10 Essential (primary) hypertension: Secondary | ICD-10-CM

## 2024-01-13 MED ORDER — HYDROCHLOROTHIAZIDE 25 MG PO TABS: 25.0000 mg | ORAL_TABLET | Freq: Every day | ORAL | 1 refills | Status: DC

## 2024-01-13 MED ORDER — LOSARTAN POTASSIUM 100 MG PO TABS
100.0000 mg | ORAL_TABLET | Freq: Every day | ORAL | 1 refills | Status: AC
Start: 2024-01-13 — End: ?

## 2024-01-13 MED ORDER — OMEPRAZOLE 40 MG PO CPDR: DELAYED_RELEASE_CAPSULE | ORAL | 3 refills | Status: DC

## 2024-01-13 MED ORDER — AMILORIDE HCL 5 MG PO TABS
5.0000 mg | ORAL_TABLET | Freq: Every day | ORAL | 1 refills | Status: AC
Start: 2024-01-13 — End: ?

## 2024-01-28 ENCOUNTER — Ambulatory Visit: Payer: Self-pay | Admitting: Cardiovascular Disease

## 2024-01-28 ENCOUNTER — Other Ambulatory Visit

## 2024-02-11 ENCOUNTER — Ambulatory Visit: Attending: Cardiology | Admitting: Cardiovascular Disease

## 2024-02-11 ENCOUNTER — Encounter: Payer: Self-pay | Admitting: Cardiovascular Disease

## 2024-02-11 DIAGNOSIS — I10 Essential (primary) hypertension: Secondary | ICD-10-CM

## 2024-02-11 DIAGNOSIS — Z7901 Long term (current) use of anticoagulants: Secondary | ICD-10-CM | POA: Diagnosis not present

## 2024-02-11 DIAGNOSIS — I4891 Unspecified atrial fibrillation: Secondary | ICD-10-CM | POA: Diagnosis not present

## 2024-02-11 DIAGNOSIS — J342 Deviated nasal septum: Secondary | ICD-10-CM

## 2024-02-11 DIAGNOSIS — G4733 Obstructive sleep apnea (adult) (pediatric): Secondary | ICD-10-CM | POA: Diagnosis not present

## 2024-02-11 NOTE — Patient Instructions (Signed)
 Medication Instructions:  Your physician recommends that you continue on your current medications as directed. Please refer to the Current Medication list given to you today.  *If you need a refill on your cardiac medications before your next appointment, please call your pharmacy*  Follow-Up: At [facility], you and your health needs are our priority.  As part of our continuing mission to provide you with exceptional heart care, our providers are all part of one team.  This team includes your primary Cardiologist (physician) and Advanced Practice Providers or APPs (Physician Assistants and Nurse Practitioners) who all work together to provide you with the care you need, when you need it.  Your next appointment:   As needed    Provider:   Dr. Wilbert Bihari   We recommend signing up for the patient portal called MyChart.  Sign up information is provided on this After Visit Summary.  MyChart is used to connect with patients for Virtual Visits (Telemedicine).  Patients are able to view lab/test results, encounter notes, upcoming appointments, etc.  Non-urgent messages can be sent to your provider as well.   To learn more about what you can do with MyChart, go to [email or web address].

## 2024-02-11 NOTE — Progress Notes (Signed)
 Cardiology Office Note    Date:  02/11/2024   ID:  Clarence Perez, DOB 07/29/1961, MRN [id number]  PCP:  Thedora Garnette HERO, MD  Cardiologist:  Debby Sor, MD (sleep); Dr. Inocencio  One year F/U sleep evaluation   History of Present Illness:  Clarence Perez is a 63 y.o. male whi is followed by Dr Inocencio who has PAF and is scheduled to undergo a potential cardiac ablation in June 2023.   He as a history of hypertension for 15 years, GERD, prostate cancer and OSA. He had initially undergone a sleep study in 2010 and had severe OSA with AHI ` 50/h. He apparently tried CPAP for a short duration. Since 2016-2017 he has a recurrent palpitations and due to concern for untreated OSA he underwent a home sleep study on 08/18/2021. Severe OSA was again demonstrated with AHI 36.3/h and he developed significant O2 desaturation to a nadir of 78%.  He had called the office and requested he speak to me, and I had a lengthy discussion with him on the phone and reviewed hid study and in light of his potential ablation tried to expedite his CPAP set up date. Shortly thereafter he received a new ResMed AirSense 11 AutoSet CPAP unit on 10/05/2021. Previous to CPAP use he was snoring, had nocturia 2 times per night and was having nocturnal palpitation. Since CPAP initiation symptoms have improved. A download from 2/17 - 11/03/2021 demonstrates compliance with usage essentially every night with average use of 6 hours and 20 minutes.  His CPAP unit is set at a pressure range of 7 to 18 cm of water.  His 95th percentile pressure is 12.6 and maximum average pressure 14.3 cm and AHI is 3.8.  Average sleep duration was 6 hours and 20 minutes.  Typically, he goes to bed around 10:20 p.m. and wakes up around 6:20 AM.  He denies any residual daytime sleepiness.  An Epworth Sleepiness Scale score was calculated in the office today and this end will images not 70s or orsed at 7 arguing against residual sleepiness.  He had  questions concerning his potential tentatively scheduled atrial fibrillation ablation.  Presently, he is on carvedilol  12.5 mg twice a day, diltiazem  CD1 180 mg daily, hydrochlorothiazide  25 mg in addition to losartan  100 mg daily for hypertension.  He is anticoagulated with Eliquis  5 mg twice a day.  He is on Lunesta  which he takes as needed for sleep initiation and maintenance.  He is on omeprazole  for GERD.  He takes albuterol  on a as needed basis for mild asthma.    I last saw him on December 02, 2022.  Since his prior evaluation, he underwent A-fib ablation at [facility] by Dr. Christyne Morel in the summer 2023.  He denied any awareness of recurrent A-fib.  He continues to be on amiloride  5 mg, hydrochlorothiazide  25 mg, losartan  100 mg for hypertension.  He is anticoagulated on Eliquis .  He takes montelukast and albuterol  for asthma.  He has issues with deviated septum and hypertrophied turbinates.  He has seen Dr. Donnice Staff at [facility] and will be undergoing surgery tentatively scheduled for May 2024.  He continues to use CPAP for his obstructive sleep apnea.  He received a ResMed AirSense 11 AutoSet unit on October 05, 2021.  He typically goes to bed at 1030 and wakes up between 430 and 530.  A download was obtained from March 17 through December 02, 2022.  Compliance is excellent with average use  at 6 hours and 8 minutes.  His pressure is set at a range of 7 to 18 cm of water.  AHI is 1.3.  His 95th percentile pressure is 13.2 with maximum average pressure of 15.0.    Since I last saw him, he underwent nasal turbinate surgery successfully and is breathing much better now.  His nose.  He has continued to use CPAP therapy but he believes he is still having problems with CPAP.  As result, he often takes his mask off and it has been very difficult for him to get used to wearing it.  He typically goes to bed around 10:15 pm and wakes up at 5:15 a.m.  He exercises in the morning on treadmill before going to work.  He now  has ResMed AirFit N30i but he still has issues.  A download was obtained from 826 through February 10, 2024.  Usage is 100% however usage is only 67% greater than 4 hours with average use on days used at 4 hours and 36 minutes.  His pressure set at a range of 7 to 18 cm.  95th percentile pressure is 9.4 with maximum average pressure 10.2.  AHI when used is excellent at 0.9.  He had discussed potential inspire with his ENT physician but would prefer at present not to pursue this.  He continues to be on carvedilol  12.5 mg, HCTZ 25 mg, losartan  100 mg daily for blood pressure control.  He is anticoagulated on Eliquis .  He tells me he was also recently started on triamterene by his primary physician due to his low potassium.  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  He presents for evaluation.  So do I need get out of his chart so that we get into his chart urinating tell me just sent triamterene did not tolerate the dose   Past Medical History:  Diagnosis Date   Asthma    Cancer (HCC)    prostate    GERD (gastroesophageal reflux disease)    Hypertension    Sleep apnea     Past Surgical History:  Procedure Laterality Date   COLONOSCOPY  09/2021   NASAL SINUS SURGERY  12/2022   prostate cryoblation      Current Medications: Outpatient Medications Prior to Visit  Medication Sig Dispense Refill   albuterol  (VENTOLIN  HFA) 108 (90 Base) MCG/ACT inhaler Inhale 2 puffs into the lungs every 4 (four) hours as needed for shortness of breath. 8 g 1   aMILoride  (MIDAMOR ) 5 MG tablet Take 1 tablet (5 mg total) by mouth daily. 90 tablet 1   apixaban  (ELIQUIS ) 5 MG TABS tablet Take 1 tablet (5 mg total) by mouth 2 (two) times daily. 180 tablet 1   carvedilol  (COREG ) 12.5 MG tablet Take 1 tablet (12.5 mg total) by mouth 2 (two) times daily with a meal. 180 tablet 3   diclofenac  Sodium (VOLTAREN ) 1 % GEL Apply 2 g topically as needed (sholduer pain). 300 g 1   EPINEPHrine 0.3 mg/0.3 mL IJ SOAJ  injection Inject into the muscle.     hydrochlorothiazide  (HYDRODIURIL ) 25 MG tablet Take 1 tablet (25 mg total) by mouth daily. 90 tablet 1   losartan  (COZAAR ) 100 MG tablet Take 1 tablet (100 mg total) by mouth daily. 90 tablet 1   omeprazole  (PRILOSEC) 40 MG capsule TAKE 1 CAPSULE BY MOUTH DAILY 90 capsule 3   potassium chloride  SA (KLOR-CON  M) 20 MEQ tablet Take 2 tablets (40 mEq total) by  mouth daily. 180 tablet 3   White Petrolatum-Mineral Oil (ARTIFICIAL TEARS) OINT ophthalmic ointment Apply to eye.     fluticasone  (FLONASE ) 50 MCG/ACT nasal spray Place 1 spray into both nostrils in the morning and at bedtime.     Lemborexant  (DAYVIGO ) 5 MG TABS Take 1 tablet (5 mg total) by mouth at bedtime as needed (sleep). Take at least 7 hours before planned awakening. 30 tablet 2   oxyCODONE (OXY IR/ROXICODONE) 5 MG immediate release tablet Take by mouth.     No facility-administered medications prior to visit.     Allergies:   Ace inhibitors   Social History   Socioeconomic History   Marital status: Married    Spouse name: Not on file   Number of children: 0   Years of education: Not on file   Highest education level: Professional school degree (e.g., MD, DDS, DVM, JD)  Occupational History   Not on file  Tobacco Use   Smoking status: Never   Smokeless tobacco: Never  Vaping Use   Vaping status: Never Used  Substance and Sexual Activity   Alcohol use: Yes    Alcohol/week: 7.0 standard drinks of alcohol    Types: 7 Cans of beer per week    Comment: 1 beer daily 07/02/21   Drug use: Never   Sexual activity: Yes  Other Topics Concern   Not on file  Social History Narrative   Not on file   Social Drivers of Health   Financial Resource Strain: Low Risk  (09/23/2023)   Overall Financial Resource Strain (CARDIA)    Difficulty of Paying Living Expenses: Not hard at all  Food Insecurity: No Food Insecurity (09/23/2023)   Hunger Vital Sign    Worried About Running Out of Food in the  Last Year: Never true    Ran Out of Food in the Last Year: Never true  Transportation Needs: Not on file  Physical Activity: Sufficiently Active (09/23/2023)   Exercise Vital Sign    Days of Exercise per Week: 5 days    Minutes of Exercise per Session: 50 min  Stress: Stress Concern Present (09/23/2023)   [employer] of Occupational Health - Occupational Stress Questionnaire    Feeling of Stress : To some extent  Social Connections: Moderately Isolated (09/23/2023)   Social Connection and Isolation Panel    Frequency of Communication with Friends and Family: Once a week    Frequency of Social Gatherings with Friends and Family: Once a week    Attends Religious Services: Never    Database administrator or Organizations: Yes    Attends Banker Meetings: Never    Marital Status: Married   Socially he was born in [place], [place]. 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   ROS General: Negative; No fevers, chills, or night sweats;  HEENT: Negative; No changes in vision or hearing, sinus congestion, difficulty swallowing Pulmonary: Allergies, mild asthma Cardiovascular: History of hypertension, PAF, GI: GERD GU: Negative; No dysuria, hematuria, or difficulty voiding Musculoskeletal: Negative; no myalgias, joint pain, or weakness Hematologic/Oncology:  Endocrine: Negative; no heat/cold intolerance; no diabetes Neuro: Negative; no changes in balance, headaches  Skin: Negative; No rashes or skin lesions Psychiatric: Negative; No behavioral problems, depression Sleep: See HPI: Initially diagnosed with OSA in 2010.  Most recently  rediagnosed December 2022.  Since initiating CPAP therapy improvement in snoring, previous nocturia, and nocturnal palpitations.  No bruxism, restless legs, hypnogognic hallucinations, no cataplexy  An Epworth Sleepiness Scale score was calculated in the office today and this endorsed at 3 arguing against residual daytime sleepiness.  Other comprehensive 14 point system review is negative.   PHYSICAL EXAM:   VS:  BP 128/78   Pulse 74   Ht 6' (1.829 m)   Wt 198 lb 9.6 oz (90.1 kg)   SpO2 94%   BMI 26.94 kg/m     Repeat blood pressure by me 120/76  Wt Readings from Last 3 Encounters:  02/11/24 198 lb 9.6 oz (90.1 kg)  09/24/23 197 lb 9.6 oz (89.6 kg)  05/21/23 197 lb 12.8 oz (89.7 kg)    General: Alert, oriented, no distress.  Skin: normal turgor, no rashes, warm and dry HEENT: Normocephalic, atraumatic. Pupils equal round and reactive to light; sclera anicteric; extraocular muscles intact;  Nose without nasal septal hypertrophy Mouth/Parynx benign; Mallinpatti scale 3/4 Neck: No JVD, no carotid bruits; normal carotid upstroke Lungs: clear to ausculatation and percussion; no wheezing or rales Chest wall: without tenderness to palpitation Heart: PMI not displaced, RRR, s1 s2 normal, 1/6 systolic murmur, no diastolic murmur, no rubs, gallops, thrills, or heaves Abdomen: soft, nontender; no hepatosplenomehaly, BS+; abdominal aorta nontender and not dilated by palpation. Back: no CVA tenderness Pulses 2+ Musculoskeletal: full range of motion, normal strength, no joint deformities Extremities: no clubbing cyanosis or edema, Homan's sign negative  Neurologic: grossly nonfocal; Cranial nerves grossly wnl Psychologic: Normal mood and affect    Studies/Labs Reviewed:   EKG Interpretation Date/Time:  Wednesday February 11 2024 14:57:17 EDT Ventricular Rate:  74 PR Interval:  166 QRS Duration:  94 QT Interval:  344 QTC Calculation: 381 R Axis:   19  Text Interpretation: Normal  sinus rhythm Nonspecific ST and T wave abnormality When compared with ECG of 21-Sep-2021 21:56, PREVIOUS ECG IS PRESENT Confirmed by Burnard Ned ([id number]) on 02/11/2024 4:57:40 PM   EKG Interpretation Date/Time:  Wednesday February 11 2024 14:57:17 EDT Ventricular Rate:  74 PR Interval:  166 QRS Duration:  94 QT Interval:  344 QTC Calculation: 381 R Axis:   19  Text Interpretation: Normal sinus rhythm Nonspecific ST and T wave abnormality When compared with ECG of 21-Sep-2021 21:56, PREVIOUS ECG IS PRESENT Confirmed by Burnard Ned ([id number]) on 02/11/2024 4:57:40 PM   December 02, 2022 ECG (independently read by me): NSR at 70, mild T wave changes laterally   December 05, 2021 ECG (independently read by me): NSR at 66; nonspecific ST changes; QTc 366  Recent Labs:    Latest Ref Rng & Units 02/26/2023    2:03 PM 11/14/2022    1:45 PM 09/25/2022    4:04 PM  BMP  Glucose 70 - 99 mg/dL 97  896    BUN 6 - 23 mg/dL 15  11    Creatinine 9.59 - 1.50 mg/dL 9.06  9.08    Sodium 864 - 145 mEq/L 138  138    Potassium 3.5 - 5.1 mEq/L 3.9  4.4  3.5   Chloride 96 - 112 mEq/L 100  100    CO2 19 - 32 mEq/L 31  31    Calcium 8.4 - 10.5 mg/dL 9.8  9.7  Latest Ref Rng & Units 05/23/2022   12:56 AM 06/25/2021    4:09 AM 02/14/2021    2:35 PM  Hepatic Function  Total Protein 6.5 - 8.1 g/dL 7.8  7.8    Albumin 3.5 - 5.0 g/dL 4.3  4.3    AST 15 - 41 U/L 21  24  20    ALT 0 - 44 U/L 24  25  25    Alk Phosphatase 38 - 126 U/L 84  96    Total Bilirubin 0.3 - 1.2 mg/dL 0.7  0.6         Latest Ref Rng & Units 05/23/2022    4:47 AM 05/23/2022   12:56 AM 09/21/2021   10:03 PM  CBC  WBC 4.0 - 10.5 K/uL  14.2  7.4   Hemoglobin 13.0 - 17.0 g/dL 86.3  84.9  84.7   Hematocrit 39.0 - 52.0 % 39.9  43.3  43.2   Platelets 150 - 400 K/uL  275  249    Lab Results  Component Value Date   MCV 89.3 05/23/2022   MCV 87.1 09/21/2021   MCV 89.7 08/01/2021   Lab Results  Component Value Date   TSH 3.342 06/25/2021    Lab Results  Component Value Date   HGBA1C 5.9 (H) 06/25/2021     BNP No results found for: BNP  ProBNP No results found for: PROBNP   Lipid Panel     Component Value Date/Time   CHOL 186 06/25/2021 0409   TRIG 179 (H) 06/25/2021 0409   HDL 52 06/25/2021 0409   CHOLHDL 3.6 06/25/2021 0409   VLDL 36 06/25/2021 0409   LDLCALC 98 06/25/2021 0409     RADIOLOGY: No results found.   Additional studies/ records that were reviewed today include:     ASSESSMENT:    1. OSA (obstructive sleep apnea)   2. Essential hypertension   3. Atrial fibrillation [facility]): s/p A-fib ablation   4. Anticoagulated   5. 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  Apparently he only tried CPAP for short duration.  Since 2016/2017, he has had recurrent palpitations.  He has been diagnosed as having paroxysmal atrial fibrillation.  When I initially saw him he has been maintaining sinus rhythm.  He underwent a follow-up sleep evaluation due to his AF which was a home study which again verified severe sleep apnea with an overall AHI of 36.3/h.  Sleep apnea was more severe with supine sleep with an AHI of 53.0.  He has significant oxygen desaturation to a nadir of 78% and time spent less than 89% was 29.3 minutes.  He had requested that I speak to him shortly after his study prior to his initial office evaluation.  At that time I reviewed his sleep study and in light of his potential upcoming ablation procedure, try to expedite his CPAP set up.  He has been on CPAP therapy since October 05, 2021 and is meeting compliance standards.  He has been using a full facemask.  On his initial download from February 17 through November 03, 2001  average sleep duration was 6 hours and 20 minutes, AHI was 3.8 and his 95th percentile pressure was 12.6 with maximum average of 14.3.  His most download from March 20 through December 04, 2021, 95th percentile pressure was 12.2 with maximum average pressure 14.1 cm and AHI 2.3.  Clinically he has noted significant improvement in his previous nocturnal palpitations with CPAP therapy.  At his initial sleep evaluation with me in April 2023 I had a long discussion with him regarding sleep apnea and its potential disruptive effects on normal sleep architecture as well as potential adverse cardiovascular consequences.  At that evaluation, he was meeting compliance and felt significantly improved with CPAP initiation. He is now followed by EP at [facility].  He underwent an atrial fibrillation ablation in December 2023 by Dr. Christyne Morel.  He had issues with deviated septum and turbinate hypertrophy for which she is seeing an ENT physician Dr. Donnice Staff at [facility] and since my last evaluation underwent successful surgical correction.  At that time the ENT doctor said his throat anatomy would be appropriate in the future if he decided to pursue Inspire.  Presently, he continues to use CPAP therapy.  He goes to bed approximately 1015 and wakes up around 515.  His download shows 100% usage days.  However average use is suboptimal and only 4 hours and 36 minutes.  However his AHI at a pressure range of 7 to 18 cm is excellent at 0.9.  Patient states he has significant difficulty wearing a mask.  It is uncomfortable.  Last year we changed him to a new ResMed AirFit N30i mask which she likes better.  However, he still typically takes his mask off approximately 4 hours after sleep initiation since he does not feel he is sleeping adequately.  I discussed with him he undoubtedly has severe sleep apnea during REM sleep by preponderance of REM sleep occurs in the second half of the night.  In taking his mask off, when he really needs CPAP  the most he is not using the CPAP which can exacerbate his blood pressure as well as potential for recurrent atrial fibrillation.  Presently, his blood pressure today is stable on his regimen of amiloride  5 mg, carvedilol  12.5 mg twice a day, HCTZ 25 mg, and losartan  100 mg daily.  He tells me he was recently put on dose of triamterene by his primary physician in light of low potassium.  He now sees Garnette Simpler for primary care.  He continues to see Dr. Morel at [facility].  He is aware of my imminent retirement.  I am transferring him to the sleep care of [facility] with follow-up visit in 1 year or sooner as needed.  Medication Adjustments/Labs and Tests Ordered: Current medicines are reviewed at length with the patient today.  Concerns regarding medicines are outlined above.  Medication changes, Labs and Tests ordered today are listed in the Patient Instructions below. Patient Instructions  Medication Instructions:  Your physician recommends that you continue on your current medications as directed. Please refer to the Current Medication list given to you today.  *If you need a refill on your cardiac medications before your next appointment, please call your pharmacy*  Follow-Up: At [facility], you and your health needs are our priority.  As part of our continuing mission to provide you with exceptional heart care, our providers are all part of one team.  This team includes your primary Cardiologist (physician) and Advanced Practice Providers or APPs (Physician Assistants and Nurse Practitioners) who all work together to provide you with the care you need, when you need it.  Your next appointment:  As needed    Provider:   Dr. Wilbert Bihari   We recommend signing up for the patient portal called MyChart.  Sign up information is provided on this After Visit Summary.  MyChart is used to connect with patients for Virtual Visits (Telemedicine).  Patients are able to view lab/test  results, encounter notes, upcoming appointments, etc.  Non-urgent messages can be sent to your provider as well.   To learn more about what you can do with MyChart, go to [email or web address].    Signed, Debby Sor, MD, [facility], ABSM Diplomate, American Board of Sleep Medicine   02/11/2024 5:06 PM    [facility] [address], [place], [place]  [address] Phone: [phone]

## 2024-02-15 ENCOUNTER — Encounter: Payer: Self-pay | Admitting: Family Medicine

## 2024-02-16 MED ORDER — CARVEDILOL 12.5 MG PO TABS: 12.5000 mg | ORAL_TABLET | Freq: Two times a day (BID) | ORAL | 0 refills | Status: DC

## 2024-02-25 ENCOUNTER — Encounter: Payer: Self-pay | Admitting: Family Medicine

## 2024-02-25 DIAGNOSIS — F5102 Adjustment insomnia: Secondary | ICD-10-CM

## 2024-02-25 MED ORDER — ESZOPICLONE 1 MG PO TABS: 1.0000 mg | ORAL_TABLET | Freq: Every evening | ORAL | 2 refills | Status: DC | PRN

## 2024-02-25 NOTE — Telephone Encounter (Signed)
 Please review patient message and advise. Thanks. Dm/cma

## 2024-03-10 ENCOUNTER — Encounter: Admitting: Family Medicine

## 2024-03-24 ENCOUNTER — Ambulatory Visit (INDEPENDENT_AMBULATORY_CARE_PROVIDER_SITE_OTHER): Admitting: Family Medicine

## 2024-03-24 ENCOUNTER — Encounter: Payer: Self-pay | Admitting: Family Medicine

## 2024-03-24 VITALS — BP 128/80 | HR 70 | Temp 97.1°F | Ht 72.0 in | Wt 198.6 lb

## 2024-03-24 DIAGNOSIS — Z8546 Personal history of malignant neoplasm of prostate: Secondary | ICD-10-CM | POA: Diagnosis not present

## 2024-03-24 DIAGNOSIS — I10 Essential (primary) hypertension: Secondary | ICD-10-CM | POA: Diagnosis not present

## 2024-03-24 DIAGNOSIS — I4891 Unspecified atrial fibrillation: Secondary | ICD-10-CM | POA: Diagnosis not present

## 2024-03-24 DIAGNOSIS — Z0000 Encounter for general adult medical examination without abnormal findings: Secondary | ICD-10-CM | POA: Diagnosis not present

## 2024-03-24 DIAGNOSIS — Z23 Encounter for immunization: Secondary | ICD-10-CM | POA: Diagnosis not present

## 2024-03-24 DIAGNOSIS — Z13220 Encounter for screening for lipoid disorders: Secondary | ICD-10-CM

## 2024-03-24 DIAGNOSIS — J3081 Allergic rhinitis due to animal (cat) (dog) hair and dander: Secondary | ICD-10-CM | POA: Insufficient documentation

## 2024-03-24 NOTE — Progress Notes (Signed)
 [facility] [address] [place] [address] Dept: [phone] Dept Fax: [phone]  Annual Physical Visit  Subjective:    Patient ID: Clarence Perez, male    DOB: [birth year], 63 y.o..   MRN: [id number]  Chief Complaint  Patient presents with   Annual Exam    CPE/labs.   No concerns.   Fasting today.    History of Present Illness:  Patient is in today for an annual physical/preventative visit.  [name] has a history of insomnia. He has both issues with sleep onset and sleep maintenance. He has been on eszopiclone  (Lunesta ), but is finding mixed results with this.   [name] has a history of hypertension. He is currently managed on losartan  100 mg daily, HCTZ 25 mg daily, and amiloride  5 mg daily. The amiloride  was added to address persistent hypokalemia issues that had developed. He is tolerating his medications well at this point. He does take a potassium supplement.   [name] has a history of atrial fibrillation. He underwent EP ablation on 02/20/2022. He remains on Eliquis  for anticoagulation.   Review of Systems  Constitutional:  Negative for chills, diaphoresis, fever, malaise/fatigue and weight loss.  HENT:  Positive for congestion. Negative for ear pain, hearing loss, sinus pain, sore throat and tinnitus.        Had sinus surgery earlier this year. This is manageable.  Eyes:  Negative for blurred vision, pain, discharge and redness.  Respiratory:  Negative for cough, shortness of breath and wheezing.   Cardiovascular:  Negative for chest pain and palpitations.  Gastrointestinal:  Positive for heartburn. Negative for abdominal pain, constipation, diarrhea, nausea and vomiting.       Heartburn is managed with omeprazole  40 mg daily and PRN Pepcid Complete.  Musculoskeletal:  Negative for back pain, joint pain and myalgias.  Skin:  Negative for itching and rash.  Psychiatric/Behavioral:  Negative for  depression. The patient has insomnia. The patient is not nervous/anxious.        Insomnia, as noted above.   Past Medical History: Patient Active Problem List   Diagnosis Date Noted   Allergic rhinitis due to animal hair and dander 03/24/2024   Status post ablation of atrial fibrillation 08/25/2023   Hypokalemia 06/26/2022   Left anterior knee pain 06/26/2022   Mild intermittent asthma 12/11/2021   Adjustment insomnia 10/30/2021   Rhinitis, chronic 07/24/2021   Atrial fibrillation with rapid ventricular response (HCC) 06/25/2021   Grade I diastolic dysfunction 06/25/2021   Hyperglycemia    Essential hypertension 11/04/2018   Gastro-esophageal reflux disease with esophagitis 12/04/2012   History of prostate cancer 06/02/2012   Obstructive sleep apnea 09/20/2011   Past Surgical History:  Procedure Laterality Date   COLONOSCOPY  09/2021   NASAL SINUS SURGERY  12/2022   prostate cryoblation     Family History  Problem Relation Age of Onset   Colon cancer Mother    Cancer Mother        colon   Hypertension Mother    Cancer Father        prostate and kidney   CAD Maternal Uncle    Heart disease Paternal Uncle    Cancer Maternal Grandmother        Kidney   Rectal cancer Neg Hx    Stomach cancer Neg Hx    Esophageal cancer Neg Hx    Outpatient Medications Prior to Visit  Medication Sig Dispense Refill   albuterol  (VENTOLIN  HFA) 108 (  90 Base) MCG/ACT inhaler Inhale 2 puffs into the lungs every 4 (four) hours as needed for shortness of breath. 8 g 1   aMILoride  (MIDAMOR ) 5 MG tablet Take 1 tablet (5 mg total) by mouth daily. 90 tablet 1   apixaban  (ELIQUIS ) 5 MG TABS tablet Take 1 tablet (5 mg total) by mouth 2 (two) times daily. 180 tablet 1   carvedilol  (COREG ) 12.5 MG tablet Take 1 tablet (12.5 mg total) by mouth 2 (two) times daily with a meal. 14 tablet 0   diclofenac  Sodium (VOLTAREN ) 1 % GEL Apply 2 g topically as needed (sholduer pain). 300 g 1   EPINEPHrine 0.3 mg/0.3  mL IJ SOAJ injection Inject into the muscle.     eszopiclone  (LUNESTA ) 1 MG TABS tablet Take 1 tablet (1 mg total) by mouth at bedtime as needed for sleep. Take immediately before bedtime 30 tablet 2   hydrochlorothiazide  (HYDRODIURIL ) 25 MG tablet Take 1 tablet (25 mg total) by mouth daily. 90 tablet 1   losartan  (COZAAR ) 100 MG tablet Take 1 tablet (100 mg total) by mouth daily. 90 tablet 1   omeprazole  (PRILOSEC) 40 MG capsule TAKE 1 CAPSULE BY MOUTH DAILY 90 capsule 3   potassium chloride  SA (KLOR-CON  M) 20 MEQ tablet Take 2 tablets (40 mEq total) by mouth daily. 180 tablet 3   White Petrolatum-Mineral Oil (ARTIFICIAL TEARS) OINT ophthalmic ointment Apply to eye.     No facility-administered medications prior to visit.   Allergies  Allergen Reactions   Ace Inhibitors Cough   Objective:   Today's Vitals   03/24/24 1504  BP: 128/80  Pulse: 70  Temp: (!) 97.1 F (36.2 C)  TempSrc: Temporal  SpO2: 98%  Weight: 198 lb 9.6 oz (90.1 kg)  Height: 6' (1.829 m)   Body mass index is 26.94 kg/m.   General: Well developed, well nourished. No acute distress. HEENT: Normocephalic, non-traumatic. PERRL, EOMI. Conjunctiva clear. External ears normal. EAC and   TMs normal bilaterally. Nose clear without congestion or rhinorrhea. Mucous membranes moist. Oropharynx   clear. Good dentition. Neck: Supple. No lymphadenopathy. No thyromegaly. Lungs: Clear to auscultation bilaterally. No wheezing, rales or rhonchi. CV: RRR without murmurs or rubs. Pulses 2+ bilaterally. Abdomen: Soft, non-tender. Bowel sounds positive, normal pitch and frequency. No hepatosplenomegaly. No   rebound or guarding. Extremities: Full ROM. No joint swelling or tenderness. No edema noted. Skin: Warm and dry. No rashes. Psych: Alert and oriented. Normal mood and affect.  Health Maintenance Due  Topic Date Due   HIV Screening  Never done   INFLUENZA VACCINE  03/19/2024   Assessment & Plan:   Problem List Items  Addressed This Visit       Cardiovascular and Mediastinum   Atrial fibrillation with rapid ventricular response (HCC)   Stable. No periods of a. fib since ablation. Continue apixaban  5 mg daily as recommended by cardiology.      Essential hypertension   Blood pressure is in good control. Continue losartan  100 mg daily, HCTZ 25 mg daily, and amiloride  5 mg daily.      Relevant Orders   Basic metabolic panel with GFR     Other   Annual physical exam - Primary   Overall health is good. Recommend ongoing regular exercise. Discussed recommended screenings and immunizations.       History of prostate cancer   I will reassess PSA test today.      Relevant Orders   PSA   Other Visit Diagnoses  Screening for lipid disorders       Relevant Orders   Lipid panel     Need for pneumococcal 20-valent conjugate vaccination       Relevant Orders   Pneumococcal conjugate vaccine 20-valent (Completed)       Return in about 4 months (around 07/24/2024) for Reassessment.   Garnette CHRISTELLA Simpler, MD

## 2024-03-24 NOTE — Assessment & Plan Note (Signed)
 Stable. No periods of a. fib since ablation. Continue apixaban  5 mg daily as recommended by cardiology.

## 2024-03-24 NOTE — Assessment & Plan Note (Signed)
 Overall health is good. Recommend ongoing regular exercise. Discussed recommended screenings and immunizations.

## 2024-03-24 NOTE — Assessment & Plan Note (Signed)
Blood pressure is in good control. Continue losartan 100 mg daily, HCTZ 25 mg daily, and amiloride 5 mg daily.

## 2024-03-24 NOTE — Assessment & Plan Note (Signed)
 I will reassess PSA test today.

## 2024-03-25 LAB — LIPID PANEL
Cholesterol: 165 mg/dL (ref 0–200)
HDL: 53.3 mg/dL (ref >39.00)
LDL Cholesterol: 89 mg/dL (ref 0–99)
NonHDL: 111.36
Total CHOL/HDL Ratio: 3
Triglycerides: 113 mg/dL (ref 0.0–149.0)
VLDL: 22.6 mg/dL (ref 0.0–40.0)

## 2024-03-25 LAB — BASIC METABOLIC PANEL WITH GFR
BUN: 14 mg/dL (ref 6–23)
CO2: 28 meq/L (ref 19–32)
Calcium: 9.5 mg/dL (ref 8.4–10.5)
Chloride: 98 meq/L (ref 96–112)
Creatinine, Ser: 0.87 mg/dL (ref 0.40–1.50)
GFR: 91.69 mL/min (ref >60.00)
Glucose, Bld: 95 mg/dL (ref 70–99)
Potassium: 4 meq/L (ref 3.5–5.1)
Sodium: 137 meq/L (ref 135–145)

## 2024-03-25 LAB — PSA: PSA: 0.51 ng/mL (ref 0.10–4.00)

## 2024-03-26 ENCOUNTER — Ambulatory Visit: Payer: Self-pay | Admitting: Family Medicine

## 2024-04-04 ENCOUNTER — Encounter: Payer: Self-pay | Admitting: Family Medicine

## 2024-04-04 DIAGNOSIS — E876 Hypokalemia: Secondary | ICD-10-CM

## 2024-04-05 MED ORDER — POTASSIUM CHLORIDE CRYS ER 20 MEQ PO TBCR
40.0000 meq | EXTENDED_RELEASE_TABLET | Freq: Every day | ORAL | 0 refills | Status: AC
Start: 2024-04-05 — End: ?

## 2024-04-19 ENCOUNTER — Ambulatory Visit
Admission: EM | Admit: 2024-04-19 | Discharge: 2024-04-19 | Disposition: A | Discharge status: Home / Self Care | Attending: Physician Assistant | Admitting: Physician Assistant

## 2024-04-19 ENCOUNTER — Ambulatory Visit

## 2024-04-19 ENCOUNTER — Other Ambulatory Visit: Payer: Self-pay

## 2024-04-19 DIAGNOSIS — J019 Acute sinusitis, unspecified: Secondary | ICD-10-CM | POA: Diagnosis not present

## 2024-04-19 MED ORDER — AMOXICILLIN-POT CLAVULANATE 875-125 MG PO TABS: 1.0000 | ORAL_TABLET | Freq: Two times a day (BID) | ORAL | 0 refills | Status: DC

## 2024-04-19 NOTE — ED Triage Notes (Addendum)
 Pt presents with complaints of cough and nasal congestion x 1.5 weeks. States he has been having mucus drain from his right nostril and it is a green discoloration. Currently denies pain. Tested COVID+ about a week and half ago. OTC Tylenol taken with no noticeable improvement. Works in [employer] and has been around sick patients. No recent fevers.

## 2024-04-19 NOTE — Discharge Instructions (Addendum)
 Based on your symptoms and duration of illness, I believe you may have a bacterial sinus infection  These typically resolve with antibiotic therapy along with at-home comfort measures  Today I have sent in a prescription for  Augmentin 875-125 mg to be taken by mouth twice per day for 7 days FINISH THE ENTIRE COURSE unless you develop an allergic reaction or are instructed to discontinue.  It can take a few days for the antibiotic to kick in so I recommend symptomatic relief with over the counter medication such as the following: Dayquil/ Nyquil Theraflu Alkaseltzer   If you have high blood pressure I recommend that you take Mucinex, Robitussin, Tylenol instead of the combination medications.  Combination medications typically have a decongestant in them that can cause your blood pressure to be high.  These medications typically have Tylenol in them already so you do not need to supplement with more outside of those medications  Stay well hydrated with at least 75 oz of water per day to help with recovery  If at any point you start to develop swelling around the eyes and nose, trouble seeing, fevers that are not responding to medications, trouble breathing, passing out or headaches that are very severe please go to the emergency room for further evaluation management

## 2024-04-19 NOTE — ED Provider Notes (Signed)
 [facility]    CSN: [id number] Arrival date & time: 04/19/24  0801      History   Chief Complaint Chief Complaint  Patient presents with   Nasal Congestion   Cough    HPI Chaz Mcglasson is a 63 y.o. male.   HPI  Pt presents today with concerns for postnasal drainage and rhinorrhea. He states this has been ongoing for about 4-5 days but about a week prior to this he was having fever, cough and other more viral symptoms  He states he has a prior hx of nasal surgeries and things seem different now with his nose He reports having green and blood tinged mucus with nasal flushes  Interventions: nasal irrigation and Tylenol. He states he was taking tylenol and robitussin  last week for symptoms but has not been using this lately    Past Medical History:  Diagnosis Date   Arrhythmia    Asthma    Cancer (HCC)    prostate    GERD (gastroesophageal reflux disease)    Hypertension    Sleep apnea     Patient Active Problem List   Diagnosis Date Noted   Allergic rhinitis due to animal hair and dander 03/24/2024   Status post ablation of atrial fibrillation 08/25/2023   Hypokalemia 06/26/2022   Left anterior knee pain 06/26/2022   Mild intermittent asthma 12/11/2021   Adjustment insomnia 10/30/2021   Rhinitis, chronic 07/24/2021   Atrial fibrillation with rapid ventricular response (HCC) 06/25/2021   Grade I diastolic dysfunction 06/25/2021   Hyperglycemia    Annual physical exam 11/04/2018   Essential hypertension 11/04/2018   Gastro-esophageal reflux disease with esophagitis 12/04/2012   History of prostate cancer 06/02/2012   Obstructive sleep apnea 09/20/2011    Past Surgical History:  Procedure Laterality Date   COLONOSCOPY  09/2021   NASAL SINUS SURGERY  12/2022   prostate cryoblation         Home Medications    Prior to Admission medications   Medication Sig Start Date End Date Taking? Authorizing Provider  amoxicillin -clavulanate  (AUGMENTIN ) 875-125 MG tablet Take 1 tablet by mouth every 12 (twelve) hours. 04/19/24  Yes Maelyn Berrey E, PA-C  albuterol  (VENTOLIN  HFA) 108 (90 Base) MCG/ACT inhaler Inhale 2 puffs into the lungs every 4 (four) hours as needed for shortness of breath. 12/11/21   Thedora Garnette HERO, MD  aMILoride  (MIDAMOR ) 5 MG tablet Take 1 tablet (5 mg total) by mouth daily. 01/13/24   Thedora Garnette HERO, MD  apixaban  (ELIQUIS ) 5 MG TABS tablet Take 1 tablet (5 mg total) by mouth 2 (two) times daily. 12/01/23   Thedora Garnette HERO, MD  carvedilol  (COREG ) 12.5 MG tablet Take 1 tablet (12.5 mg total) by mouth 2 (two) times daily with a meal. 02/16/24   Thedora Garnette HERO, MD  diclofenac  Sodium (VOLTAREN ) 1 % GEL Apply 2 g topically as needed (sholduer pain). 03/22/22   Thedora Garnette HERO, MD  EPINEPHrine 0.3 mg/0.3 mL IJ SOAJ injection Inject into the muscle. 09/18/21   [provider]  eszopiclone  (LUNESTA ) 1 MG TABS tablet Take 1 tablet (1 mg total) by mouth at bedtime as needed for sleep. Take immediately before bedtime 02/25/24   Thedora Garnette HERO, MD  hydrochlorothiazide  (HYDRODIURIL ) 25 MG tablet Take 1 tablet (25 mg total) by mouth daily. 01/13/24   Thedora Garnette HERO, MD  losartan  (COZAAR ) 100 MG tablet Take 1 tablet (100 mg total) by mouth daily. 01/13/24   Thedora Garnette HERO,  MD  omeprazole  (PRILOSEC) 40 MG capsule TAKE 1 CAPSULE BY MOUTH DAILY 01/13/24   Thedora Garnette HERO, MD  potassium chloride  SA (KLOR-CON  M) 20 MEQ tablet Take 2 tablets (40 mEq total) by mouth daily. 04/05/24   Thedora Garnette HERO, MD  White Petrolatum-Mineral Oil (ARTIFICIAL TEARS) OINT ophthalmic ointment Apply to eye.    [provider]    Family History Family History  Problem Relation Age of Onset   Colon cancer Mother    Cancer Mother        colon   Hypertension Mother    Cancer Father        prostate and kidney   CAD Maternal Uncle    Heart disease Paternal Uncle    Cancer Maternal Grandmother        Kidney   Rectal cancer Neg Hx    Stomach  cancer Neg Hx    Esophageal cancer Neg Hx     Social History Social History   Tobacco Use   Smoking status: Never   Smokeless tobacco: Never  Vaping Use   Vaping status: Never Used  Substance Use Topics   Alcohol use: Yes    Alcohol/week: 7.0 standard drinks of alcohol    Types: 7 Cans of beer per week    Comment: 1 beer daily 07/02/21   Drug use: Never     Allergies   Ace inhibitors   Review of Systems Review of Systems  Constitutional:  Negative for chills and fever.  HENT:  Positive for postnasal drip, rhinorrhea, sinus pressure and sinus pain. Negative for facial swelling.   Eyes:  Negative for pain.  Respiratory:  Negative for cough and shortness of breath.      Physical Exam Triage Vital Signs ED Triage Vitals  Encounter Vitals Group     BP 04/19/24 0817 138/83     Girls Systolic BP Percentile --      Girls Diastolic BP Percentile --      Boys Systolic BP Percentile --      Boys Diastolic BP Percentile --      Pulse Rate 04/19/24 0817 64     Resp 04/19/24 0817 18     Temp 04/19/24 0817 98.1 F (36.7 C)     Temp Source 04/19/24 0817 Oral     SpO2 04/19/24 0817 95 %     Weight 04/19/24 0819 195 lb (88.5 kg)     Height 04/19/24 0819 6' (1.829 m)     Head Circumference --      Peak Flow --      Pain Score 04/19/24 0819 0     Pain Loc --      Pain Education --      Exclude from Growth Chart --    No data found.  Updated Vital Signs BP 138/83 (BP Location: Right Arm)   Pulse 64   Temp 98.1 F (36.7 C) (Oral)   Resp 18   Ht 6' (1.829 m)   Wt 195 lb (88.5 kg)   SpO2 95%   BMI 26.45 kg/m   Visual Acuity Right Eye Distance:   Left Eye Distance:   Bilateral Distance:    Right Eye Near:   Left Eye Near:    Bilateral Near:     Physical Exam Vitals reviewed.  Constitutional:      General: He is awake. He is not in acute distress.    Appearance: Normal appearance. He is well-developed and well-groomed. He is not ill-appearing or  toxic-appearing.  HENT:     Head: Normocephalic and atraumatic.     Right Ear: Hearing, tympanic membrane and ear canal normal.     Left Ear: Hearing, tympanic membrane and ear canal normal.     Nose: Mucosal edema and rhinorrhea present. No nasal deformity, signs of injury or nasal tenderness. Rhinorrhea is purulent and bloody.     Right Nostril: No foreign body or occlusion.     Left Nostril: No foreign body, epistaxis, septal hematoma or occlusion.     Right Turbinates: Not enlarged, swollen or pale.     Left Turbinates: Not enlarged, swollen or pale.     Right Sinus: No maxillary sinus tenderness or frontal sinus tenderness.     Left Sinus: No maxillary sinus tenderness or frontal sinus tenderness.     Mouth/Throat:     Lips: Pink.     Mouth: Mucous membranes are moist.  Eyes:     General: Lids are normal. Gaze aligned appropriately.     Extraocular Movements: Extraocular movements intact.  Cardiovascular:     Rate and Rhythm: Normal rate and regular rhythm.     Heart sounds: Normal heart sounds.  Pulmonary:     Effort: Pulmonary effort is normal.     Breath sounds: Normal breath sounds. No decreased air movement. No decreased breath sounds, wheezing, rhonchi or rales.  Musculoskeletal:     Cervical back: Normal range of motion and neck supple.  Lymphadenopathy:     Head:     Right side of head: No submental, submandibular or preauricular adenopathy.     Left side of head: No submental, submandibular or preauricular adenopathy.     Cervical:     Right cervical: No superficial cervical adenopathy.    Left cervical: No superficial cervical adenopathy.     Upper Body:     Right upper body: No supraclavicular adenopathy.     Left upper body: No supraclavicular adenopathy.  Skin:    General: Skin is warm and dry.  Neurological:     General: No focal deficit present.     Mental Status: He is alert and oriented to person, place, and time.  Psychiatric:        Mood and Affect:  Mood normal.        Behavior: Behavior normal. Behavior is cooperative.        Thought Content: Thought content normal.        Judgment: Judgment normal.      UC Treatments / Results  Labs (all labs ordered are listed, but only abnormal results are displayed) Labs Reviewed - No data to display  EKG   Radiology No results found.  Procedures Procedures (including critical care time)  Medications Ordered in UC Medications - No data to display  Initial Impression / Assessment and Plan / UC Course  I have reviewed the triage vital signs and the nursing notes.  Pertinent labs & imaging results that were available during my care of the patient were reviewed by me and considered in my medical decision making (see chart for details).     Lab results from 03/24/24 reviewed to assist with medication dosing- eGFR of >60 noted   Final Clinical Impressions(s) / UC Diagnoses   Final diagnoses:  Acute sinusitis, recurrence not specified, unspecified location   Patient presents today with concerns for ongoing purulent and bloody nasal drainage.  Patient reports that prior to symptoms starting he did have some viral type symptoms of fever, coughing, nasal congestion  that have since resolved leaving current symptoms.  He reports that he has been taking Tylenol as needed and doing nasal flushes/rinses to assist with symptoms.  He reports that if he does not perform a nasal rinse he typically has postnasal drainage.  Physical exam demonstrates erythema of the nostrils as well as some lingering purulent and bloody rhinorrhea particularly in the right nostril.  Given chronicity of symptoms with lack of improvement with home measures we will start antibiotic for acute sinusitis.  Will start Augmentin  p.o. twice daily x 7 days.  If symptoms are not improving or seem to be worsening recommend follow-up with PCP or return urgent care as needed.    Discharge Instructions      Based on your symptoms  and duration of illness, I believe you may have a bacterial sinus infection  These typically resolve with antibiotic therapy along with at-home comfort measures  Today I have sent in a prescription for  Augmentin  875-125 mg to be taken by mouth twice per day for 7 days FINISH THE ENTIRE COURSE unless you develop an allergic reaction or are instructed to discontinue.  It can take a few days for the antibiotic to kick in so I recommend symptomatic relief with over the counter medication such as the following: Dayquil/ Nyquil Theraflu Alkaseltzer   If you have high blood pressure I recommend that you take Mucinex, Robitussin, Tylenol instead of the combination medications.  Combination medications typically have a decongestant in them that can cause your blood pressure to be high.  These medications typically have Tylenol in them already so you do not need to supplement with more outside of those medications  Stay well hydrated with at least 75 oz of water per day to help with recovery  If at any point you start to develop swelling around the eyes and nose, trouble seeing, fevers that are not responding to medications, trouble breathing, passing out or headaches that are very severe please go to the emergency room for further evaluation management      ED Prescriptions     Medication Sig Dispense Auth. Provider   amoxicillin -clavulanate (AUGMENTIN ) 875-125 MG tablet Take 1 tablet by mouth every 12 (twelve) hours. 14 tablet Edita Weyenberg E, PA-C      PDMP not reviewed this encounter.   Amayiah Gosnell, Rocky BRAVO, PA-C 04/19/24 [id number]

## 2024-04-26 ENCOUNTER — Encounter: Payer: Self-pay | Admitting: Family Medicine

## 2024-07-02 ENCOUNTER — Emergency Department (HOSPITAL_COMMUNITY)

## 2024-07-02 ENCOUNTER — Emergency Department (HOSPITAL_COMMUNITY)
Admission: EM | Admit: 2024-07-02 | Discharge: 2024-07-02 | Disposition: A | Discharge status: Home / Self Care | Attending: Emergency Medicine | Admitting: Emergency Medicine

## 2024-07-02 DIAGNOSIS — Z7901 Long term (current) use of anticoagulants: Secondary | ICD-10-CM | POA: Insufficient documentation

## 2024-07-02 DIAGNOSIS — I493 Ventricular premature depolarization: Secondary | ICD-10-CM | POA: Insufficient documentation

## 2024-07-02 DIAGNOSIS — R002 Palpitations: Secondary | ICD-10-CM

## 2024-07-02 LAB — CBC
HCT: 45.3 % (ref 39.0–52.0)
Hemoglobin: 15.9 g/dL (ref 13.0–17.0)
MCH: 30.7 pg (ref 26.0–34.0)
MCHC: 35.1 g/dL (ref 30.0–36.0)
MCV: 87.5 fL (ref 80.0–100.0)
Platelets: 281 K/uL (ref 150–400)
RBC: 5.18 MIL/uL (ref 4.22–5.81)
RDW: 12.7 % (ref 11.5–15.5)
WBC: 6.3 K/uL (ref 4.0–10.5)
nRBC: 0 % (ref 0.0–0.2)

## 2024-07-02 LAB — TROPONIN T, HIGH SENSITIVITY
Troponin T High Sensitivity: <15 ng/L (ref 0–19)
Troponin T High Sensitivity: <15 ng/L (ref 0–19)

## 2024-07-02 LAB — MAGNESIUM: Magnesium: 2.1 mg/dL (ref 1.7–2.4)

## 2024-07-02 LAB — BASIC METABOLIC PANEL WITH GFR
Anion gap: 12 (ref 5–15)
BUN: 17 mg/dL (ref 8–23)
CO2: 26 mmol/L (ref 22–32)
Calcium: 10.4 mg/dL — ABNORMAL HIGH (ref 8.9–10.3)
Chloride: 100 mmol/L (ref 98–111)
Creatinine, Ser: 0.92 mg/dL (ref 0.61–1.24)
GFR, Estimated: >60 mL/min (ref >60)
Glucose, Bld: 156 mg/dL — ABNORMAL HIGH (ref 70–99)
Potassium: 3.7 mmol/L (ref 3.5–5.1)
Sodium: 137 mmol/L (ref 135–145)

## 2024-07-02 MED ORDER — POTASSIUM CHLORIDE CRYS ER 20 MEQ PO TBCR
40.0000 meq | EXTENDED_RELEASE_TABLET | Freq: Once | ORAL | Status: AC
  Administered 2024-07-02: 40 meq via ORAL
  Filled 2024-07-02: qty 2

## 2024-07-02 MED ORDER — ALUM & MAG HYDROXIDE-SIMETH 200-200-20 MG/5ML PO SUSP
30.0000 mL | Freq: Once | ORAL | Status: AC
  Administered 2024-07-02: 30 mL via ORAL
  Filled 2024-07-02: qty 30

## 2024-07-02 NOTE — Discharge Instructions (Signed)
 Please continue your regular medications and try to get regular exercise and sleep.  Keep well-hydrated.  Follow-up with your cardiologist and PCP.  Return to the emergency department if any worsening or concerning symptoms

## 2024-07-02 NOTE — ED Provider Triage Note (Signed)
 Emergency Medicine Provider Triage Evaluation Note  Trevaris Pennella , a 63 y.o. male  was evaluated in triage. Pt complains of palpitations.  History of A-fib.  Started about 2 hours ago.  He states he felt anxious and nauseous and felt palpitations.  May have felt a slight twinge in his chest but no chest pain at this time.  He denies any chest pain or shortness of breath at this time.  He reports a history of cardiac ablation.  Compliant with Eliquis .  Review of Systems  Positive: See above Negative: See above  Physical Exam  BP (!) 154/102   Pulse 87   Resp 11   SpO2 100%  Gen:   Awake, no distress   Resp:  Normal effort  MSK:   Moves extremities without difficulty  Other:    Medical Decision Making  Medically screening exam initiated at 1:17 PM.  Appropriate orders placed.  Torey Reinard was informed that the remainder of the evaluation will be completed by another provider, this initial triage assessment does not replace that evaluation, and the importance of remaining in the ED until their evaluation is complete.  Work up started   Lang Norleen POUR, PA-C 07/02/24 1321

## 2024-07-02 NOTE — ED Triage Notes (Signed)
 Palpitations starting a few hours ago, some CP. Hx afib. Patient anxious, nauseous. Took zofran  at home. Sweaty earlier.

## 2024-07-02 NOTE — ED Provider Notes (Signed)
 [place] [facility] AT [facility] Provider Note   CSN: [id number] Arrival date & time: 07/02/24  1210     Patient presents with: Palpitations   Clarence Perez is a 63 y.o. male.  He has a history of A-fib and underwent ablation at [facility].  He maintains with cardiology there.  Today he felt palpitations associated with some nausea and dizziness.  Also had some heartburn.  No recent fevers chills diarrhea urinary symptoms.  Has been compliant with his medication.  Does endorse a lot of stress.   The history is provided by the patient.  Palpitations Palpitations quality:  Irregular Onset quality:  Gradual Duration:  1 day Timing:  Intermittent Progression:  Improving Chronicity:  Recurrent Relieved by:  None tried Worsened by:  Nothing Ineffective treatments:  None tried Associated symptoms: chest pain, dizziness and nausea   Associated symptoms: no shortness of breath and no syncope        Prior to Admission medications   Medication Sig Start Date End Date Taking? Authorizing Provider  albuterol  (VENTOLIN  HFA) 108 (90 Base) MCG/ACT inhaler Inhale 2 puffs into the lungs every 4 (four) hours as needed for shortness of breath. 12/11/21   Thedora Garnette HERO, MD  aMILoride  (MIDAMOR ) 5 MG tablet Take 1 tablet (5 mg total) by mouth daily. 01/13/24   Thedora Garnette HERO, MD  amoxicillin -clavulanate (AUGMENTIN ) 875-125 MG tablet Take 1 tablet by mouth every 12 (twelve) hours. 04/19/24   Mecum, Erin E, PA-C  apixaban  (ELIQUIS ) 5 MG TABS tablet Take 1 tablet (5 mg total) by mouth 2 (two) times daily. 12/01/23   Thedora Garnette HERO, MD  carvedilol  (COREG ) 12.5 MG tablet Take 1 tablet (12.5 mg total) by mouth 2 (two) times daily with a meal. 02/16/24   Thedora Garnette HERO, MD  diclofenac  Sodium (VOLTAREN ) 1 % GEL Apply 2 g topically as needed (sholduer pain). 03/22/22   Thedora Garnette HERO, MD  EPINEPHrine 0.3 mg/0.3 mL IJ SOAJ injection Inject into the muscle. 09/18/21   [provider]   eszopiclone  (LUNESTA ) 1 MG TABS tablet Take 1 tablet (1 mg total) by mouth at bedtime as needed for sleep. Take immediately before bedtime 02/25/24   Thedora Garnette HERO, MD  hydrochlorothiazide  (HYDRODIURIL ) 25 MG tablet Take 1 tablet (25 mg total) by mouth daily. 01/13/24   Thedora Garnette HERO, MD  losartan  (COZAAR ) 100 MG tablet Take 1 tablet (100 mg total) by mouth daily. 01/13/24   Thedora Garnette HERO, MD  omeprazole  (PRILOSEC) 40 MG capsule TAKE 1 CAPSULE BY MOUTH DAILY 01/13/24   Thedora Garnette HERO, MD  potassium chloride  SA (KLOR-CON  M) 20 MEQ tablet Take 2 tablets (40 mEq total) by mouth daily. 04/05/24   Thedora Garnette HERO, MD  White Petrolatum-Mineral Oil (ARTIFICIAL TEARS) OINT ophthalmic ointment Apply to eye.    [provider]    Allergies: Ace inhibitors    Review of Systems  Respiratory:  Negative for shortness of breath.   Cardiovascular:  Positive for chest pain and palpitations. Negative for syncope.  Gastrointestinal:  Positive for nausea.  Neurological:  Positive for dizziness.    Updated Vital Signs BP (!) 154/102   Pulse 87   Resp 11   SpO2 100%   Physical Exam Vitals and nursing note reviewed.  Constitutional:      Appearance: Normal appearance. He is well-developed.  HENT:     Head: Normocephalic and atraumatic.  Eyes:     Conjunctiva/sclera: Conjunctivae normal.  Cardiovascular:  Rate and Rhythm: Normal rate and regular rhythm.     Heart sounds: No murmur heard. Pulmonary:     Effort: Pulmonary effort is normal. No respiratory distress.     Breath sounds: Normal breath sounds.  Abdominal:     Palpations: Abdomen is soft.     Tenderness: There is no abdominal tenderness.  Musculoskeletal:     Cervical back: Neck supple.     Right lower leg: No edema.     Left lower leg: No edema.  Skin:    General: Skin is warm and dry.  Neurological:     General: No focal deficit present.     Mental Status: He is alert and oriented to person, place, and time.     GCS:  GCS eye subscore is 4. GCS verbal subscore is 5. GCS motor subscore is 6.     Motor: No weakness.     Gait: Gait normal.     (all labs ordered are listed, but only abnormal results are displayed) Labs Reviewed  BASIC METABOLIC PANEL WITH GFR - Abnormal; Notable for the following components:      Result Value   Glucose, Bld 156 (*)    Calcium 10.4 (*)    All other components within normal limits  CBC  MAGNESIUM  TROPONIN T, HIGH SENSITIVITY  TROPONIN T, HIGH SENSITIVITY    EKG: EKG Interpretation Date/Time:  Friday July 02 2024 12:22:02 EST Ventricular Rate:  101 PR Interval:  155 QRS Duration:  84 QT Interval:  340 QTC Calculation: 421 R Axis:   25  Text Interpretation: Sinus tachycardia Ventricular bigeminy Abnormal R-wave progression, early transition Abnormal T, consider ischemia, lateral leads Confirmed by Towana Sharper [phone]) on 07/02/2024 12:55:53 PM  Radiology: [facility] Chest 2 View Result Date: 07/02/2024 CLINICAL DATA:  Chest pain. EXAM: DG CHEST 2V COMPARISON:  05/23/2022. FINDINGS: The heart size and mediastinal contours are within normal limits. Both lungs are clear. No pleural effusion or pneumothorax. No acute osseous abnormality. IMPRESSION: No acute cardiopulmonary findings. Electronically Signed   By: Harrietta Sherry M.D.   On: 07/02/2024 13:12     Procedures   Medications Ordered in the ED - No data to display                                  Medical Decision Making Amount and/or Complexity of Data Reviewed Labs: ordered. Radiology: ordered.  Risk OTC drugs. Prescription drug management.   This patient complains of palpitations feeling lightheaded and dizzy; this involves an extensive number of treatment Options and is a complaint that carries with it a high risk of complications and morbidity. The differential includes arrhythmia, dehydration, metabolic derangement, anemia, ACS  I ordered, reviewed and interpreted labs, which included  CBC normal, chemistries with mildly elevated glucose, troponins flat I ordered medication oral potassium, Maalox and reviewed PMP when indicated. I ordered imaging studies which included chest x-ray and I independently    visualized and interpreted imaging which showed no acute findings Additional history obtained from patient's wife Previous records obtained and reviewed in epic including recent PCP notes Cardiac monitoring reviewed, sinus rhythm with PVCs Social determinants considered, no significant barriers Critical Interventions: None  After the interventions stated above, I reevaluated the patient and found patient to be feeling symptomatically better Admission and further testing considered, he is comfortable plan for discharge and outpatient follow-up with his treatment team.  Return instructions discussed  Final diagnoses:  Palpitations  PVC's (premature ventricular contractions)    ED Discharge Orders     None          Towana Ozell BROCKS, MD 07/02/24 1909

## 2024-07-19 ENCOUNTER — Encounter: Payer: Self-pay | Admitting: Family Medicine

## 2024-07-19 DIAGNOSIS — G4733 Obstructive sleep apnea (adult) (pediatric): Secondary | ICD-10-CM

## 2024-08-01 ENCOUNTER — Encounter: Payer: Self-pay | Admitting: Family Medicine

## 2024-08-01 ENCOUNTER — Other Ambulatory Visit: Payer: Self-pay | Admitting: Family Medicine

## 2024-08-01 DIAGNOSIS — I10 Essential (primary) hypertension: Secondary | ICD-10-CM

## 2024-08-02 ENCOUNTER — Ambulatory Visit: Admitting: Emergency Medicine

## 2024-08-02 ENCOUNTER — Encounter: Payer: Self-pay | Admitting: Emergency Medicine

## 2024-08-02 VITALS — BP 152/84 | HR 82 | Resp 16 | Ht 72.0 in | Wt 200.6 lb

## 2024-08-02 DIAGNOSIS — H539 Unspecified visual disturbance: Secondary | ICD-10-CM

## 2024-08-02 DIAGNOSIS — R519 Headache, unspecified: Secondary | ICD-10-CM | POA: Diagnosis not present

## 2024-08-02 NOTE — Patient Instructions (Addendum)
 MRI brain has been ordered today  [phone] is the number for urgent eye clinic with [facility] if you cannot otherwise get in with ophthalmologist promptly for the recent vision changes  Plan is for close follow-up with Dr Thedora after the above tests, with ER in the interim if there are sudden new/worsening/severe headache or vision concerns.

## 2024-08-02 NOTE — Telephone Encounter (Signed)
 Patient made an appointment to come in a see Corean today @ 1:20. Dm/cma

## 2024-08-02 NOTE — Progress Notes (Signed)
 Assessment & Plan:   Assessment & Plan New onset of headaches Intermittent mild but daily x 93mo new headaches, mostly right-sided, with transient visual disturbances. No classic migraine symptoms. Differential includes stress-related headaches and multifactorial causes. No signs of temporal arteritis nor suspected infectious etiology.  - Ordered MRI of the brain if insurance approves; otherwise, will order CT with contrast. - Recommend close follow up for formal eye exam. - Coordinated with Dr. Thedora for further decision-making and follow-up. Orders:   MR BRAIN W WO CONTRAST; Future  Transient vision disturbance       Clarence Perez, MSPAS, PA-C    Subjective:   Chief Complaint  Patient presents with   Headache    Inconsistent headache for one month. Denies noticing anything that triggers the headache. Tylenol slightly helps.    Eye Problem    Inconsistent loss of portions of the visual field x 3 weeks     HPI:  Discussed the use of AI scribe software for clinical note transcription with the patient, who gave verbal consent to proceed.  History of Present Illness Clarence Perez is a 63 year old male who presents with new onset headaches and visual disturbances.  He has had almost daily, intermittent headaches for about a month. The pain is usually right-sided in the temporal region, sometimes over the right eye and occasionally on the left. They are mild, do not wake him from sleep, and Tylenol is possibly helpful but he isn't certain.  He has had three to four episodes of visual disturbance described as areas where he cannot see and a shimmering effect, sometimes with eyes closed. Episodes have occurred in both eyes, most recently in the right, and are associated with only a mild ache rather than severe or throbbing pain- not temporally associated with the daily headaches.  He denies any sudden onset, thunderclap, or severe headache. Denies confusion, slurred  speech, vomiting, neck stiffness, rash, fevers.   He sleeps about five hours per night and uses CPAP for sleep apnea. He has anxiety with nausea and occasional disequilibrium triggered by rapid head movements for about six months.  He has atrial fibrillation and was recently seen in the ER for palpitations and frequent PVCs. He takes multiple blood pressure medications and Eliquis , with no recent changes. He had sinus surgery in May of last year without issues until these symptoms began.  He has not had an eye exam in over a year. He notices a persistent visual phenomenon in the right eye like a floater that appears when looking at light and moving his head quickly. It is not present in the dark or with eyes closed and does not otherwise affect his vision.     ROS: Negative unless specifically indicated above in HPI.   Relevant past medical history reviewed and updated as indicated.  Past Medical History:  Diagnosis Date   Arrhythmia    Asthma    Cancer (HCC)    prostate    GERD (gastroesophageal reflux disease)    Hypertension    Sleep apnea     Allergies and medications reviewed and updated.  Current Medications[1]  Allergies[2]  Social History[3]   Objective:   Vitals:   08/02/24 1315 08/02/24 1355  BP: (!) 158/96 (!) 152/84  Pulse: 82   Resp: 16   Height: 6' (1.829 m)   Weight: 200 lb 9.6 oz (91 kg)   SpO2: 99%   BMI (Calculated): 27.2      Appears anxious  but well TM clear. Eyes PERRL, EOMI visual fields full at 3 feet. Sclera anicteric.  Oral mucosa moist Heart occas ectopic beats.  Normal resp effort and excursion. CTAB Alert and oriented x 3. CN 3-12 intact. F-N, H-S, RAM intact bilaterally. Normal gait without truncal or limb ataxia. Negative romberg. Marching in place normal.      [1]  Current Outpatient Medications:    albuterol  (VENTOLIN  HFA) 108 (90 Base) MCG/ACT inhaler, Inhale 2 puffs into the lungs every 4 (four) hours as needed for shortness  of breath., Disp: 8 g, Rfl: 1   aMILoride  (MIDAMOR ) 5 MG tablet, TAKE 1 TABLET DAILY, Disp: 90 tablet, Rfl: 3   carvedilol  (COREG ) 12.5 MG tablet, TAKE 1 TABLET TWICE A DAY WITH MEALS, Disp: 180 tablet, Rfl: 3   diclofenac  Sodium (VOLTAREN ) 1 % GEL, Apply 2 g topically as needed (sholduer pain)., Disp: 300 g, Rfl: 1   ELIQUIS  5 MG TABS tablet, TAKE 1 TABLET TWICE A DAY, Disp: 180 tablet, Rfl: 3   EPINEPHrine 0.3 mg/0.3 mL IJ SOAJ injection, Inject into the muscle., Disp: , Rfl:    eszopiclone  (LUNESTA ) 1 MG TABS tablet, Take 1 tablet (1 mg total) by mouth at bedtime as needed for sleep. Take immediately before bedtime, Disp: 30 tablet, Rfl: 2   hydrochlorothiazide  (HYDRODIURIL ) 25 MG tablet, TAKE 1 TABLET DAILY, Disp: 90 tablet, Rfl: 3   losartan  (COZAAR ) 100 MG tablet, TAKE 1 TABLET DAILY, Disp: 90 tablet, Rfl: 3   omeprazole  (PRILOSEC) 40 MG capsule, TAKE 1 CAPSULE BY MOUTH DAILY, Disp: 90 capsule, Rfl: 3   potassium chloride  SA (KLOR-CON  M) 20 MEQ tablet, Take 2 tablets (40 mEq total) by mouth daily., Disp: 30 tablet, Rfl: 0   White Petrolatum-Mineral Oil (ARTIFICIAL TEARS) OINT ophthalmic ointment, Apply to eye., Disp: , Rfl:    amoxicillin -clavulanate (AUGMENTIN ) 875-125 MG tablet, Take 1 tablet by mouth every 12 (twelve) hours. (Patient not taking: Reported on 08/02/2024), Disp: 14 tablet, Rfl: 0 [2]  Allergies Allergen Reactions   Ace Inhibitors Cough  [3]  Social History Tobacco Use   Smoking status: Never   Smokeless tobacco: Never  Vaping Use   Vaping status: Never Used  Substance Use Topics   Alcohol use: Yes    Alcohol/week: 7.0 standard drinks of alcohol    Types: 7 Cans of beer per week    Comment: 1 beer daily 07/02/21   Drug use: Never

## 2024-08-10 ENCOUNTER — Encounter: Payer: Self-pay | Admitting: Family Medicine

## 2024-08-13 ENCOUNTER — Other Ambulatory Visit: Payer: Self-pay | Admitting: Family

## 2024-08-13 DIAGNOSIS — F5102 Adjustment insomnia: Secondary | ICD-10-CM

## 2024-08-13 MED ORDER — ESZOPICLONE 1 MG PO TABS: 1.0000 mg | ORAL_TABLET | Freq: Every evening | ORAL | 2 refills | Status: AC | PRN

## 2024-08-27 ENCOUNTER — Ambulatory Visit: Admitting: Family Medicine

## 2024-09-07 ENCOUNTER — Ambulatory Visit: Admitting: Family Medicine

## 2024-09-21 ENCOUNTER — Ambulatory Visit: Admitting: Family Medicine

## 2024-09-21 ENCOUNTER — Other Ambulatory Visit (HOSPITAL_COMMUNITY): Payer: Self-pay

## 2024-09-21 ENCOUNTER — Telehealth: Payer: Self-pay

## 2024-09-21 NOTE — Telephone Encounter (Signed)
 Will advise patient at appointment on 09/22/24. Dm/cma

## 2024-09-22 ENCOUNTER — Ambulatory Visit: Admitting: Family Medicine

## 2024-09-22 VITALS — BP 130/76 | HR 74 | Temp 98.0°F | Ht 72.0 in | Wt 195.4 lb

## 2024-09-22 DIAGNOSIS — R140 Abdominal distension (gaseous): Secondary | ICD-10-CM | POA: Insufficient documentation

## 2024-09-22 DIAGNOSIS — K2100 Gastro-esophageal reflux disease with esophagitis, without bleeding: Secondary | ICD-10-CM

## 2024-09-22 DIAGNOSIS — I4891 Unspecified atrial fibrillation: Secondary | ICD-10-CM

## 2024-09-22 DIAGNOSIS — I10 Essential (primary) hypertension: Secondary | ICD-10-CM

## 2024-09-22 DIAGNOSIS — G43109 Migraine with aura, not intractable, without status migrainosus: Secondary | ICD-10-CM | POA: Insufficient documentation

## 2024-09-22 DIAGNOSIS — R519 Headache, unspecified: Secondary | ICD-10-CM

## 2024-09-22 MED ORDER — OMEPRAZOLE 40 MG PO CPDR: DELAYED_RELEASE_CAPSULE | ORAL | 3 refills | Status: AC

## 2024-09-22 NOTE — Assessment & Plan Note (Signed)
 Stable. No periods of a. fib since ablation. Continue apixaban  5 mg daily as recommended by cardiology.

## 2024-09-22 NOTE — Assessment & Plan Note (Signed)
 Managing expectantly for now. Trying to get better rest.

## 2024-09-22 NOTE — Assessment & Plan Note (Deleted)
 Dr. Talmadge Fail has insomnia that is no longer responding well to eszopiclone  (Lunesta ). I recommend we switch to a different class of sleep medicine. I will try him on lemborexant  (Dayvigo ) 5 mg daily.

## 2024-09-22 NOTE — Assessment & Plan Note (Signed)
Blood pressure is in good control. Continue losartan 100 mg daily, HCTZ 25 mg daily, and amiloride 5 mg daily.

## 2024-09-22 NOTE — Progress Notes (Signed)
 " [facility] [address] [place] [address] Dept: [phone] Dept Fax: [phone]  Chronic Care Office Visit  Subjective:    Patient ID: Clarence Perez, male    DOB: 24-Apr-1961, 64 y.o..   MRN: [id number]  Chief Complaint  Patient presents with   Hypertension    4 month f/u HTN.  C/o having abdominal bloating.    History of Present Illness:  Patient is in today for reassessment of chronic medical conditions.   Clarence Perez has a history of hypertension. He is currently managed on losartan  100 mg daily, HCTZ 25 mg daily, and amiloride  5 mg daily. The amiloride  was added to address persistent hypokalemia issues that had developed. He is tolerating his medications well at this point. He does take a potassium supplement. Now that he has retired, he is able to engage in daily exercise and feels he has less stress. He is working on weight loss.   Clarence Perez has a history of atrial fibrillation. He underwent EP ablation on 02/20/2022. He remains on Eliquis  5 mg bid for anticoagulation.   Clarence Perez was seen in December related to daily headaches that he has started experiencing. He notes these continue to occur. The are overall brief, not lasting long enough to consider taking a medication. He was seen by Clarence Geralds, PA-C. She ahd ordered an MRI scan, which Clarence Perez never had performed. As he had some ocular symptoms around the same time, he was seen by ophthalmology. They determined the ocular manifestations were ocular migraines. He has had a couple fo recurrences of this since his evaluation in December.  Clarence Perez has noted recurrent sensation of lower abdominal bloating. This is associated with increased intestinal gas. He occasionally has some mild constipation. He has tried eliminating milk products, without improvement. He has used Gas-X, which doesn't seem to help. He denies any hematochezia or change in the caliber  of his stools. He admits to be a nervous sort, so wonders about IBS.  Past Medical History: Patient Active Problem List   Diagnosis Date Noted   Allergic rhinitis due to animal hair and dander 03/24/2024   Status post ablation of atrial fibrillation 08/25/2023   Hypokalemia 06/26/2022   Left anterior knee pain 06/26/2022   Mild intermittent asthma 12/11/2021   Adjustment insomnia 10/30/2021   Rhinitis, chronic 07/24/2021   Atrial fibrillation with rapid ventricular response (HCC) 06/25/2021   Grade I diastolic dysfunction 06/25/2021   Hyperglycemia    Annual physical exam 11/04/2018   Essential hypertension 11/04/2018   Gastro-esophageal reflux disease with esophagitis 12/04/2012   History of prostate cancer 06/02/2012   Obstructive sleep apnea 09/20/2011   Past Surgical History:  Procedure Laterality Date   COLONOSCOPY  09/2021   NASAL SINUS SURGERY  12/2022   prostate cryoblation     Family History  Problem Relation Age of Onset   Colon cancer Mother    Cancer Mother        colon   Hypertension Mother    Cancer Father        prostate and kidney   CAD Maternal Uncle    Heart disease Paternal Uncle    Cancer Maternal Grandmother        Kidney   Rectal cancer Neg Hx    Stomach cancer Neg Hx    Esophageal cancer Neg Hx    Outpatient Medications Prior to Visit  Medication Sig Dispense Refill   albuterol  (VENTOLIN  HFA) 108 (90  Base) MCG/ACT inhaler Inhale 2 puffs into the lungs every 4 (four) hours as needed for shortness of breath. 8 g 1   aMILoride  (MIDAMOR ) 5 MG tablet TAKE 1 TABLET DAILY 90 tablet 3   amoxicillin -clavulanate (AUGMENTIN ) 875-125 MG tablet Take 1 tablet by mouth every 12 (twelve) hours. (Patient not taking: Reported on 08/02/2024) 14 tablet 0   carvedilol  (COREG ) 12.5 MG tablet TAKE 1 TABLET TWICE A DAY WITH MEALS 180 tablet 3   diclofenac  Sodium (VOLTAREN ) 1 % GEL Apply 2 g topically as needed (sholduer pain). 300 g 1   ELIQUIS  5 MG TABS tablet TAKE 1  TABLET TWICE A DAY 180 tablet 3   EPINEPHrine 0.3 mg/0.3 mL IJ SOAJ injection Inject into the muscle.     eszopiclone  (LUNESTA ) 1 MG TABS tablet Take 1 tablet (1 mg total) by mouth at bedtime as needed for sleep. Take immediately before bedtime 30 tablet 2   hydrochlorothiazide  (HYDRODIURIL ) 25 MG tablet TAKE 1 TABLET DAILY 90 tablet 3   losartan  (COZAAR ) 100 MG tablet TAKE 1 TABLET DAILY 90 tablet 3   omeprazole  (PRILOSEC) 40 MG capsule TAKE 1 CAPSULE BY MOUTH DAILY 90 capsule 3   potassium chloride  SA (KLOR-CON  M) 20 MEQ tablet Take 2 tablets (40 mEq total) by mouth daily. 30 tablet 0   White Petrolatum-Mineral Oil (ARTIFICIAL TEARS) OINT ophthalmic ointment Apply to eye.     No facility-administered medications prior to visit.   Allergies[1]   Objective:   Today's Vitals   09/22/24 1604  BP: 130/76  Pulse: 74  Temp: 98 F (36.7 C)  TempSrc: Temporal  SpO2: 97%  Weight: 195 lb 6.4 oz (88.6 kg)  Height: 6' (1.829 m)   Body mass index is 26.5 kg/m.   General: Well developed, well nourished. No acute distress. Abdomen: Soft. Bowel sounds positive, normal pitch and frequency.  Psych: Alert and oriented. Normal mood and affect.  Health Maintenance Due  Topic Date Due   HIV Screening  Never done   Influenza Vaccine  03/19/2024   COVID-19 Vaccine (8 - 2025-26 season) 04/19/2024     Assessment & Plan:   Problem List Items Addressed This Visit       Cardiovascular and Mediastinum   Atrial fibrillation with rapid ventricular response (HCC)   Stable. No periods of a. fib since ablation. Continue apixaban  5 mg daily as recommended by cardiology.      Essential hypertension - Primary   Blood pressure is in good control. Continue losartan  100 mg daily, HCTZ 25 mg daily, and amiloride  5 mg daily.      Ocular migraine   Managing expectantly for now. Trying to get better rest.        Digestive   Gastro-esophageal reflux disease with esophagitis   I will renew his  omeprazole  40 mg daily.      Relevant Medications   omeprazole  (PRILOSEC) 40 MG capsule     Other   Abdominal bloating   Symptoms could be related to IBS. We discussed him trying a low FODMAP diet to see if he can identify any specific food triggers.      Other Visit Diagnoses       Daily headache       Nonspecific. We discussed him monitoring for now. If pattern is worsening, he could try and move ahed with the MRI previously ordered.       Return in about 4 months (around 01/20/2025) for Reassessment.   Garnette CHRISTELLA Simpler, MD  I,Emily Lagle,acting as a scribe for Garnette CHRISTELLA Simpler, MD.,have documented all relevant documentation on the behalf of Garnette CHRISTELLA Simpler, MD.  I, Garnette CHRISTELLA Simpler, MD, have reviewed all documentation for this visit. The documentation on 09/22/2024 for the exam, diagnosis, procedures, and orders are all accurate and complete.      [1]  Allergies Allergen Reactions   Ace Inhibitors Cough   "

## 2024-09-22 NOTE — Assessment & Plan Note (Signed)
 I will renew his omeprazole  40 mg daily.

## 2024-09-22 NOTE — Assessment & Plan Note (Signed)
 Symptoms could be related to IBS. We discussed him trying a low FODMAP diet to see if he can identify any specific food triggers.

## 2024-09-24 ENCOUNTER — Other Ambulatory Visit (HOSPITAL_COMMUNITY): Payer: Self-pay

## 2025-01-19 ENCOUNTER — Ambulatory Visit: Admitting: Family Medicine
